# Patient Record
Sex: Male | Born: 1985
Health system: Southern US, Community
[De-identification: ages and names within clinical notes are randomized; demographics above are authoritative.]

## PROBLEM LIST (undated history)

## (undated) DIAGNOSIS — K5010 Crohn's disease of large intestine without complications: Secondary | ICD-10-CM

## (undated) DIAGNOSIS — E0500 Thyrotoxicosis with diffuse goiter without thyrotoxic crisis or storm: Secondary | ICD-10-CM

## (undated) HISTORY — PX: NO PAST SURGERIES: SHX2092

---
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
## 2003-04-19 ENCOUNTER — Ambulatory Visit (HOSPITAL_BASED_OUTPATIENT_CLINIC_OR_DEPARTMENT_OTHER): Admission: RE | Admit: 2003-04-19 | Discharge: 2003-04-19 | Payer: Self-pay | Admitting: *Deleted

## 2003-10-25 ENCOUNTER — Ambulatory Visit (HOSPITAL_COMMUNITY): Admission: RE | Admit: 2003-10-25 | Discharge: 2003-10-25 | Payer: Self-pay | Admitting: Gastroenterology

## 2006-12-18 ENCOUNTER — Ambulatory Visit: Payer: Self-pay | Admitting: Sports Medicine

## 2006-12-18 ENCOUNTER — Encounter: Admission: RE | Admit: 2006-12-18 | Discharge: 2006-12-18 | Payer: Self-pay | Admitting: Sports Medicine

## 2007-02-14 ENCOUNTER — Telehealth: Payer: Self-pay | Admitting: Sports Medicine

## 2008-03-31 ENCOUNTER — Encounter: Payer: Self-pay | Admitting: Internal Medicine

## 2012-06-11 ENCOUNTER — Other Ambulatory Visit: Payer: Self-pay | Admitting: Dermatology

## 2014-06-17 ENCOUNTER — Other Ambulatory Visit: Payer: Self-pay | Admitting: Dermatology

## 2019-10-07 ENCOUNTER — Other Ambulatory Visit: Payer: Self-pay

## 2019-10-07 DIAGNOSIS — Z20822 Contact with and (suspected) exposure to covid-19: Secondary | ICD-10-CM

## 2019-10-09 LAB — NOVEL CORONAVIRUS, NAA: SARS-CoV-2, NAA: NOT DETECTED

## 2019-10-22 ENCOUNTER — Emergency Department (HOSPITAL_COMMUNITY): Payer: Managed Care, Other (non HMO)

## 2019-10-22 ENCOUNTER — Emergency Department (HOSPITAL_COMMUNITY)
Admission: EM | Admit: 2019-10-22 | Discharge: 2019-10-22 | Disposition: A | Discharge status: Home / Self Care | Payer: Managed Care, Other (non HMO) | Attending: Emergency Medicine | Admitting: Emergency Medicine

## 2019-10-22 ENCOUNTER — Encounter (HOSPITAL_COMMUNITY): Payer: Self-pay

## 2019-10-22 ENCOUNTER — Other Ambulatory Visit: Payer: Self-pay

## 2019-10-22 DIAGNOSIS — R197 Diarrhea, unspecified: Secondary | ICD-10-CM | POA: Diagnosis present

## 2019-10-22 DIAGNOSIS — R509 Fever, unspecified: Secondary | ICD-10-CM | POA: Diagnosis not present

## 2019-10-22 DIAGNOSIS — E860 Dehydration: Secondary | ICD-10-CM | POA: Diagnosis not present

## 2019-10-22 DIAGNOSIS — U071 COVID-19: Secondary | ICD-10-CM | POA: Diagnosis not present

## 2019-10-22 HISTORY — DX: Thyrotoxicosis with diffuse goiter without thyrotoxic crisis or storm: E05.00

## 2019-10-22 HISTORY — DX: Crohn's disease of large intestine without complications: K50.10

## 2019-10-22 LAB — BASIC METABOLIC PANEL
Anion gap: 10 (ref 5–15)
BUN: 9 mg/dL (ref 6–20)
CO2: 26 mmol/L (ref 22–32)
Calcium: 8.6 mg/dL — ABNORMAL LOW (ref 8.9–10.3)
Chloride: 99 mmol/L (ref 98–111)
Creatinine, Ser: 1.14 mg/dL (ref 0.61–1.24)
GFR calc Af Amer: >60 mL/min (ref >60)
GFR calc non Af Amer: >60 mL/min (ref >60)
Glucose, Bld: 102 mg/dL — ABNORMAL HIGH (ref 70–99)
Potassium: 4 mmol/L (ref 3.5–5.1)
Sodium: 135 mmol/L (ref 135–145)

## 2019-10-22 LAB — FIBRINOGEN: Fibrinogen: 534 mg/dL — ABNORMAL HIGH (ref 210–475)

## 2019-10-22 LAB — CBC
HCT: 43.7 % (ref 39.0–52.0)
Hemoglobin: 15.2 g/dL (ref 13.0–17.0)
MCH: 30.6 pg (ref 26.0–34.0)
MCHC: 34.8 g/dL (ref 30.0–36.0)
MCV: 88.1 fL (ref 80.0–100.0)
Platelets: 213 10*3/uL (ref 150–400)
RBC: 4.96 MIL/uL (ref 4.22–5.81)
RDW: 11.6 % (ref 11.5–15.5)
WBC: 9.4 10*3/uL (ref 4.0–10.5)
nRBC: 0 % (ref 0.0–0.2)

## 2019-10-22 LAB — HEPATIC FUNCTION PANEL
ALT: 99 U/L — ABNORMAL HIGH (ref 0–44)
AST: 27 U/L (ref 15–41)
Albumin: 3.6 g/dL (ref 3.5–5.0)
Alkaline Phosphatase: 128 U/L — ABNORMAL HIGH (ref 38–126)
Bilirubin, Direct: 0.2 mg/dL (ref 0.0–0.2)
Indirect Bilirubin: 0.8 mg/dL (ref 0.3–0.9)
Total Bilirubin: 1 mg/dL (ref 0.3–1.2)
Total Protein: 7.2 g/dL (ref 6.5–8.1)

## 2019-10-22 LAB — LACTATE DEHYDROGENASE: LDH: 170 U/L (ref 98–192)

## 2019-10-22 LAB — C-REACTIVE PROTEIN: CRP: 9.4 mg/dL — ABNORMAL HIGH (ref <1.0)

## 2019-10-22 LAB — PROCALCITONIN: Procalcitonin: <0.1 ng/mL

## 2019-10-22 LAB — TRIGLYCERIDES: Triglycerides: 86 mg/dL (ref <150)

## 2019-10-22 LAB — LACTIC ACID, PLASMA: Lactic Acid, Venous: 0.9 mmol/L (ref 0.5–1.9)

## 2019-10-22 LAB — T4, FREE: Free T4: 1.63 ng/dL — ABNORMAL HIGH (ref 0.61–1.12)

## 2019-10-22 LAB — D-DIMER, QUANTITATIVE: D-Dimer, Quant: 0.93 ug/mL-FEU — ABNORMAL HIGH (ref 0.00–0.50)

## 2019-10-22 LAB — TSH: TSH: <0.01 u[IU]/mL — ABNORMAL LOW (ref 0.350–4.500)

## 2019-10-22 LAB — FERRITIN: Ferritin: 185 ng/mL (ref 24–336)

## 2019-10-22 MED ORDER — ACETAMINOPHEN 500 MG PO TABS: 1000.0000 mg | ORAL_TABLET | Freq: Once | ORAL | Status: DC

## 2019-10-22 MED ORDER — SODIUM CHLORIDE 0.9 % IV BOLUS
1000.0000 mL | Freq: Once | INTRAVENOUS | Status: AC
  Administered 2019-10-22: 1000 mL via INTRAVENOUS

## 2019-10-22 MED ORDER — DEXAMETHASONE SODIUM PHOSPHATE 10 MG/ML IJ SOLN
10.0000 mg | Freq: Once | INTRAMUSCULAR | Status: AC
  Administered 2019-10-22: 21:00:00 10 mg via INTRAVENOUS
  Filled 2019-10-22: qty 1

## 2019-10-22 MED ORDER — IBUPROFEN 400 MG PO TABS
600.0000 mg | ORAL_TABLET | Freq: Once | ORAL | Status: AC
  Administered 2019-10-22: 21:00:00 600 mg via ORAL
  Filled 2019-10-22: qty 1

## 2019-10-22 NOTE — ED Provider Notes (Addendum)
[facility] EMERGENCY DEPARTMENT Provider Note   CSN: [id number] Arrival date & time: 10/22/19  1947     History Chief Complaint  Patient presents with  . Covid+/Diarrhea    Raymond Branch is a 33 y.o. male.  Pt presents to the ED today with diarrhea and fever.  Pt has had a covid exposure and tested positive for covid today.  The pt has Crohn's disease and has had a lot of diarrhea.  Pt said he's not sure if the diarrhea is from Crohn's or from covid.  He called his GI doctor who told him to come in for IVFs.  The pt said he took tylenol around 1800.  No sob.        Past Medical History:  Diagnosis Date  . Crohn's colitis ([place])   . Graves disease     There are no problems to display for this patient.       No family history on file.  Social History   Tobacco Use  . Smoking status: Never Smoker  . Smokeless tobacco: Never Used  Substance Use Topics  . Alcohol use: Yes  . Drug use: Never    Home Medications Prior to Admission medications   Not on File    Allergies    Patient has no known allergies.  Review of Systems   Review of Systems  Constitutional: Positive for fatigue and fever.  Gastrointestinal: Positive for diarrhea.  All other systems reviewed and are negative.   Physical Exam Updated Vital Signs BP 132/84   Pulse (!) 126   Temp 100 F (37.8 C) (Oral)   Resp (!) 31   SpO2 96%   Physical Exam Vitals and nursing note reviewed.  Constitutional:      Appearance: Normal appearance.  HENT:     Head: Normocephalic and atraumatic.     Right Ear: External ear normal.     Left Ear: External ear normal.     Nose: Nose normal.     Mouth/Throat:     Mouth: Mucous membranes are dry.  Eyes:     Extraocular Movements: Extraocular movements intact.     Conjunctiva/sclera: Conjunctivae normal.     Pupils: Pupils are equal, round, and reactive to light.  Cardiovascular:     Rate and Rhythm: Regular rhythm. Tachycardia  present.     Pulses: Normal pulses.     Heart sounds: Normal heart sounds.  Pulmonary:     Effort: Pulmonary effort is normal.     Breath sounds: Normal breath sounds.  Abdominal:     General: Abdomen is flat. Bowel sounds are normal.     Palpations: Abdomen is soft.  Musculoskeletal:        General: Normal range of motion.     Cervical back: Normal range of motion and neck supple.  Skin:    Capillary Refill: Capillary refill takes less than 2 seconds.  Neurological:     General: No focal deficit present.     Mental Status: He is alert and oriented to person, place, and time.  Psychiatric:        Mood and Affect: Mood normal.        Behavior: Behavior normal.        Thought Content: Thought content normal.        Judgment: Judgment normal.     ED Results / Procedures / Treatments   Labs (all labs ordered are listed, but only abnormal results are displayed) Labs Reviewed  BASIC  METABOLIC PANEL - Abnormal; Notable for the following components:      Result Value   Glucose, Bld 102 (*)    Calcium 8.6 (*)    All other components within normal limits  D-DIMER, QUANTITATIVE (NOT AT [facility]) - Abnormal; Notable for the following components:   D-Dimer, Quant 0.93 (*)    All other components within normal limits  FIBRINOGEN - Abnormal; Notable for the following components:   Fibrinogen 534 (*)    All other components within normal limits  C-REACTIVE PROTEIN - Abnormal; Notable for the following components:   CRP 9.4 (*)    All other components within normal limits  HEPATIC FUNCTION PANEL - Abnormal; Notable for the following components:   ALT 99 (*)    Alkaline Phosphatase 128 (*)    All other components within normal limits  TSH - Abnormal; Notable for the following components:   TSH <0.010 (*)    All other components within normal limits  T3, FREE - Abnormal; Notable for the following components:   T3, Free 4.7 (*)    All other components within normal limits  T4, FREE -  Abnormal; Notable for the following components:   Free T4 1.63 (*)    All other components within normal limits  CULTURE, BLOOD (ROUTINE X 2)  CULTURE, BLOOD (ROUTINE X 2)  CBC  LACTIC ACID, PLASMA  PROCALCITONIN  LACTATE DEHYDROGENASE  FERRITIN  TRIGLYCERIDES    EKG None  Not coming through on MUSE HR 86.  NO ST or T wave changes. Sinus arrhythmia  Radiology No results found.  Procedures Procedures (including critical care time)  Medications Ordered in ED Medications  sodium chloride 0.9 % bolus 1,000 mL (0 mLs Intravenous Stopped 10/22/19 2249)  ibuprofen (ADVIL) tablet 600 mg (600 mg Oral Given 10/22/19 2129)  dexamethasone (DECADRON) injection 10 mg (10 mg Intravenous Given 10/22/19 2129)    ED Course  I have reviewed the triage vital signs and the nursing notes.  Pertinent labs & imaging results that were available during my care of the patient were reviewed by me and considered in my medical decision making (see chart for details).    MDM Rules/Calculators/A&P                      Pt is feeling better.  He is dehydrated, but is able to tolerate po fluids.    His TSH is <0.01.  He has been on methimazole for the past month.  He sees Dr. Buddy Duty from endocrinology.  He is instructed to f/u with him in the next few days.  I did add on T3 and T4 as pt will likely need an evisit as he has covid.   His oxygenation is great and CXR ok.    Pt knows to return if worse.  F/u with pcp.  Raymond Branch was evaluated in Emergency Department on 11/09/2019 for the symptoms described in the history of present illness. He was evaluated in the context of the global COVID-19 pandemic, which necessitated consideration that the patient might be at risk for infection with the SARS-CoV-2 virus that causes COVID-19. Institutional protocols and algorithms that pertain to the evaluation of patients at risk for COVID-19 are in a state of rapid change based on information released by regulatory  bodies including the CDC and federal and state organizations. These policies and algorithms were followed during the patient's care in the ED.   Final Clinical Impression(s) / ED Diagnoses Final diagnoses:  Dehydration  COVID-19 virus infection  Diarrhea, unspecified type    Rx / DC Orders ED Discharge Orders    None       Isla Pence, MD 10/22/19 2250    Isla Pence, MD 11/09/19 1030

## 2019-10-22 NOTE — Discharge Instructions (Addendum)
Your TSH today was <0.01.  Call Dr. Buddy Duty tomorrow to see if he wants you to increase your methimazole.  Continue taking your prednisone for 1 more week.  Drink lots of fluids.

## 2019-10-22 NOTE — ED Triage Notes (Signed)
Pt tested positive for Covid this a.m. and due to his underlying condition of Crohn's and continued diarrhea, his GI dr told him to come to ER for IV fluids.

## 2019-10-24 LAB — T3, FREE: T3, Free: 4.7 pg/mL — ABNORMAL HIGH (ref 2.0–4.4)

## 2019-10-27 LAB — CULTURE, BLOOD (ROUTINE X 2)
Culture: NO GROWTH
Culture: NO GROWTH
Special Requests: ADEQUATE
Special Requests: ADEQUATE

## 2020-04-27 ENCOUNTER — Encounter: Payer: Self-pay | Admitting: Family Medicine

## 2020-04-27 ENCOUNTER — Ambulatory Visit (INDEPENDENT_AMBULATORY_CARE_PROVIDER_SITE_OTHER): Payer: Managed Care, Other (non HMO)

## 2020-04-27 ENCOUNTER — Other Ambulatory Visit: Payer: Self-pay

## 2020-04-27 ENCOUNTER — Ambulatory Visit: Payer: Managed Care, Other (non HMO) | Admitting: Family Medicine

## 2020-04-27 VITALS — BP 100/84 | HR 70 | Wt 201.0 lb

## 2020-04-27 DIAGNOSIS — G8929 Other chronic pain: Secondary | ICD-10-CM | POA: Diagnosis not present

## 2020-04-27 DIAGNOSIS — M545 Low back pain, unspecified: Secondary | ICD-10-CM

## 2020-04-27 DIAGNOSIS — M999 Biomechanical lesion, unspecified: Secondary | ICD-10-CM | POA: Insufficient documentation

## 2020-04-27 NOTE — Progress Notes (Signed)
[facility] [address] [place] Phone: [phone] Subjective:   I Raymond Branch am serving as a Education administrator for Dr. Hulan Saas.  This visit occurred during the SARS-CoV-2 public health emergency.  Safety protocols were in place, including screening questions prior to the visit, additional usage of staff PPE, and extensive cleaning of exam room while observing appropriate contact time as indicated for disinfecting solutions.   I'm seeing this patient by the request  of:  Patient, No Pcp Per  CC: Low back pain  [id number]  Raymond Branch is a 34 y.o. male coming in with complaint of low back pain. Numbness in his feet after crossing his legs for long periods of time.   Onset- 2 months  Location - lower  Duration-  Character- tight  Aggravating factors- painful after sitting, crossing his legs  Reliving factors-  Therapies tried- stretching  Severity-5 out of 10.  Does not stop him from any true activities at the moment.     Past Medical History:  Diagnosis Date  . Crohn's colitis ([place])   . Graves disease     Social History   Socioeconomic History  . Marital status: Married    Spouse name: Not on file  . Number of children: Not on file  . Years of education: Not on file  . Highest education level: Not on file  Occupational History  . Not on file  Tobacco Use  . Smoking status: Never Smoker  . Smokeless tobacco: Never Used  Vaping Use  . Vaping Use: Never used  Substance and Sexual Activity  . Alcohol use: Yes  . Drug use: Never  . Sexual activity: Not Currently  Other Topics Concern  . Not on file  Social History Narrative  . Not on file   Social Determinants of Health   Financial Resource Strain:   . Difficulty of Paying Living Expenses:   Food Insecurity:   . Worried About Charity fundraiser in the Last Year:   . Arboriculturist in the Last Year:   Transportation Needs:   . Film/video editor  (Medical):   Marland Kitchen Lack of Transportation (Non-Medical):   Physical Activity:   . Days of Exercise per Week:   . Minutes of Exercise per Session:   Stress:   . Feeling of Stress :   Social Connections:   . Frequency of Communication with Friends and Family:   . Frequency of Social Gatherings with Friends and Family:   . Attends Religious Services:   . Active Member of Clubs or Organizations:   . Attends Archivist Meetings:   Marland Kitchen Marital Status:    No Known Allergies No family history on file. No current outpatient medications on file.   Reviewed prior external information including notes and imaging from  primary care provider As well as notes that were available from care everywhere and other healthcare systems.  Past medical history, social, surgical and family history all reviewed in electronic medical record.  No pertanent information unless stated regarding to the chief complaint.   Review of Systems:  No headache, visual changes, nausea, vomiting, diarrhea, constipation, dizziness, abdominal pain, skin rash, fevers, chills, night sweats, weight loss, swollen lymph nodes, body aches, joint swelling, chest pain, shortness of breath, mood changes. POSITIVE muscle aches  Objective  Blood pressure 100/84, pulse 70, weight 201 lb (91.2 kg), SpO2 97 %.   General: No apparent distress alert and oriented x3  mood and affect normal, dressed appropriately.  HEENT: Pupils equal, extraocular movements intact  Respiratory: Patient's speak in full sentences and does not appear short of breath  Cardiovascular: No lower extremity edema, non tender, no erythema  Neuro: Cranial nerves II through XII are intact, neurovascularly intact in all extremities with 2+ DTRs and 2+ pulses.  Gait normal with good balance and coordination.  MSK:  Non tender with full range of motion and good stability and symmetric strength and tone of shoulders, elbows, wrist, hip, knee and ankles bilaterally.    Low back exam some mild loss of lordosis.  Some tenderness to palpation in the paraspinal musculature of the lumbar spine right greater than left.  Tightness over the right FABER test noted.  Does have some very mild limited range of motion in the last 5 degrees of flexion  Osteopathic findings C2 flexed rotated and side bent right T9 extended rotated and side bent left L2 flexed rotated and side bent right Sacrum right on right    Impression and Recommendations:     The above documentation has been reviewed and is accurate and complete Raymond Pulley, DO       Note: This dictation was prepared with Dragon dictation along with smaller phrase technology. Any transcriptional errors that result from this process are unintentional.

## 2020-04-27 NOTE — Assessment & Plan Note (Signed)
Patient does have more of the low back pain.  Describing as a dull, throbbing aching sensation.  Patient should do well with conservative therapy.  Patient given home exercise today that I think will be helpful.  Discussed icing regimen.  Will increase activity slowly.  Follow-up again 4 to 8 weeks.  Attempted osteopathic manipulation today

## 2020-04-27 NOTE — Assessment & Plan Note (Addendum)
° °  Decision today to treat with OMT was based on Physical Exam  After verbal consent patient was treated with HVLA, ME, FPR techniques in cervical, thoracic,  lumbar and sacral areas, all areas are chronic   Patient tolerated the procedure well with improvement in symptoms  Patient given exercises, stretches and lifestyle modifications  See medications in patient instructions if given  Patient will follow up in 4-8 weeks

## 2020-04-27 NOTE — Patient Instructions (Signed)
Good to see you Adjust handle bars up on bike Exercise 3 times a week Xray on the way out Stay active its not the mattress See me again in 5-6 weeks

## 2020-06-02 ENCOUNTER — Ambulatory Visit: Payer: Managed Care, Other (non HMO) | Admitting: Family Medicine

## 2020-06-02 ENCOUNTER — Other Ambulatory Visit: Payer: Self-pay

## 2020-06-02 ENCOUNTER — Encounter: Payer: Self-pay | Admitting: Family Medicine

## 2020-06-02 VITALS — BP 118/74 | HR 64 | Wt 199.0 lb

## 2020-06-02 DIAGNOSIS — M999 Biomechanical lesion, unspecified: Secondary | ICD-10-CM | POA: Diagnosis not present

## 2020-06-02 DIAGNOSIS — G8929 Other chronic pain: Secondary | ICD-10-CM | POA: Diagnosis not present

## 2020-06-02 DIAGNOSIS — M79609 Pain in unspecified limb: Secondary | ICD-10-CM | POA: Diagnosis not present

## 2020-06-02 DIAGNOSIS — M545 Low back pain, unspecified: Secondary | ICD-10-CM

## 2020-06-02 NOTE — Progress Notes (Signed)
  [facility] [place] Phone: [phone] Subjective:    I'm seeing this patient by the request  of:  Patient, No Pcp Per  CC: Back pain follow-up, knee pain  [id number]  Raymond Branch is a 34 y.o. male coming in with complaint of back and neck pain. OMT 04/27/2020. Patient states that his back isn't as sore and tight.   Left knee pain. 2004 surgery lateral meniscus. Tightness in back of knee.  States that he does not feel like he has full movement symptoms.  Medications patient has been prescribed: Only taking over-the-counter medicines           Reviewed prior external information including notes and imaging from previsou exam, outside providers and external EMR if available.   As well as notes that were available from care everywhere and other healthcare systems.  Past medical history, social, surgical and family history all reviewed in electronic medical record.  No pertanent information unless stated regarding to the chief complaint.   Past Medical History:  Diagnosis Date  . Crohn's colitis ([place])   . Graves disease     No Known Allergies   Review of Systems:  No headache, visual changes, nausea, vomiting, diarrhea, constipation, dizziness, abdominal pain, skin rash, fevers, chills, night sweats, weight loss, swollen lymph nodes, body aches, joint swelling, chest pain, shortness of breath, mood changes. POSITIVE  Objective  Blood pressure 118/74, pulse 64, weight 199 lb (90.3 kg), SpO2 95 %.   General: No apparent distress alert and oriented x3 mood and affect normal, dressed appropriately.  HEENT: Pupils equal, extraocular movements intact  Respiratory: Patient's speak in full sentences and does not appear short of breath  Cardiovascular: No lower extremity edema, non tender, no erythema  Neuro: Cranial nerves II through XII are intact, neurovascularly intact in all extremities with 2+ DTRs and 2+  pulses.  Gait normal with good balance and coordination.  MSK: Left knee exam shows the patient does have more tenderness over the popliteal area.  Negative Thompson.  Patient has some mild audible popping of the popliteal tendon noted.  Likely some arthritic changes of the lateral compartment.  Osteopathic findings   T5 extended rotated and side bent left L2 flexed rotated and side bent right Sacrum right on right     Assessment and Plan:  Low back pain Stable overall.  Responding well to manipulation.  Discussed posture and ergonomics, discussed core strengthening.  Follow-up again in 4 to 6 weeks  Nonallopathic lesion of cervical region   Decision today to treat with OMT was based on Physical Exam  After verbal consent patient was treated with HVLA, ME, FPR techniques in cervical, thoracic, rib, lumbar and sacral areas, all areas are chronic   Patient tolerated the procedure well with improvement in symptoms  Patient given exercises, stretches and lifestyle modifications  See medications in patient instructions if given  Patient will follow up in 4-6 weeks        The above documentation has been reviewed and is accurate and complete [name], DO       Note: This dictation was prepared with Dragon dictation along with smaller phrase technology. Any transcriptional errors that result from this process are unintentional.

## 2020-06-02 NOTE — Assessment & Plan Note (Signed)
Stable overall.  Responding well to manipulation.  Discussed posture and ergonomics, discussed core strengthening.  Follow-up again in 4 to 6 weeks

## 2020-06-02 NOTE — Patient Instructions (Addendum)
Knee compression sleeve  latereral unloader webbed knee brace Congrats! See me again in 2 months

## 2020-06-02 NOTE — Assessment & Plan Note (Addendum)
   Decision today to treat with OMT was based on Physical Exam  After verbal consent patient was treated with HVLA, ME, FPR techniques in , thoracic,, lumbar and sacral areas, all areas are chronic   Patient tolerated the procedure well with improvement in symptoms  Patient given exercises, stretches and lifestyle modifications  See medications in patient instructions if given  Patient will follow up in 4-6 weeks

## 2020-06-02 NOTE — Assessment & Plan Note (Signed)
Likely tenodnitis, discussed possible OA, lateral compartment.  Continue need x-rays at follow-up.  May need a lateral unloader brace and will consider.  Follow-up again 8 weeks

## 2020-06-06 ENCOUNTER — Telehealth: Payer: Self-pay

## 2020-06-06 NOTE — Telephone Encounter (Signed)
Called to notify patient that knee brace is here. He can come any time before 4:30 without an appointment to be fitted for the brace.

## 2020-06-07 ENCOUNTER — Telehealth: Payer: Self-pay

## 2020-06-07 NOTE — Telephone Encounter (Signed)
Left message notifying patient that knee brace is here.

## 2020-08-02 NOTE — Progress Notes (Signed)
[place] Phone: [phone] Subjective:   [name], am serving as a scribe for Dr. Hulan Saas. This visit occurred during the SARS-CoV-2 public health emergency.  Safety protocols were in place, including screening questions prior to the visit, additional usage of staff PPE, and extensive cleaning of exam room while observing appropriate contact time as indicated for disinfecting solutions.   I'm seeing this patient by the request  of:  Patient, No Pcp Per  CC: Knee pain and back pain follow-up  [id number]   06/02/2020 Likely tenodnitis, discussed possible OA, lateral compartment.  Continue need x-rays at follow-up.  May need a lateral unloader brace and will consider.  Follow-up again 8 weeks  Update 08/03/2020 Raymond Branch is a 34 y.o. male coming in with complaint of knee and back pain. OMT 06/02/2020. States his back pain did not go away for the 1st month after seeing [place]. Back pain is better today.  Patient states that it does still want to consider the manipulation.  Has felt better with it than previously.  Been doing the exercises very regularly.  Working out intermittently.  Has had difficulty with his Graves' disease and because of this has gained some weight.  Knows that that is causing increased stress right now.  Knee pain is not any better. Squatting and lunging continue to bother him. Is active despite pain.  Otherwise patient does not notice anything significant.     Past Medical History:  Diagnosis Date  . Crohn's colitis ([place])   . Graves disease    Past Surgical History:  Procedure Laterality Date  . NO PAST SURGERIES     Social History   Socioeconomic History  . Marital status: Married    Spouse name: Not on file  . Number of children: Not on file  . Years of education: Not on file  . Highest education level: Not on file  Occupational History  . Not on file  Tobacco Use    . Smoking status: Never Smoker  . Smokeless tobacco: Never Used  Vaping Use  . Vaping Use: Never used  Substance and Sexual Activity  . Alcohol use: Yes  . Drug use: Never  . Sexual activity: Not Currently  Other Topics Concern  . Not on file  Social History Narrative  . Not on file   Social Determinants of Health   Financial Resource Strain:   . Difficulty of Paying Living Expenses: Not on file  Food Insecurity:   . Worried About Charity fundraiser in the Last Year: Not on file  . Ran Out of Food in the Last Year: Not on file  Transportation Needs:   . Lack of Transportation (Medical): Not on file  . Lack of Transportation (Non-Medical): Not on file  Physical Activity:   . Days of Exercise per Week: Not on file  . Minutes of Exercise per Session: Not on file  Stress:   . Feeling of Stress : Not on file  Social Connections:   . Frequency of Communication with Friends and Family: Not on file  . Frequency of Social Gatherings with Friends and Family: Not on file  . Attends Religious Services: Not on file  . Active Member of Clubs or Organizations: Not on file  . Attends Archivist Meetings: Not on file  . Marital Status: Not on file   No Known Allergies No family history on file. No current outpatient medications  on file.   Reviewed prior external information including notes and imaging from  primary care provider As well as notes that were available from care everywhere and other healthcare systems.  Past medical history, social, surgical and family history all reviewed in electronic medical record.  No pertanent information unless stated regarding to the chief complaint.   Review of Systems:  No headache, visual changes, nausea, vomiting, diarrhea, constipation, dizziness, abdominal pain, skin rash, fevers, chills, night sweats, weight loss, swollen lymph nodes, body aches, joint swelling, chest pain, shortness of breath, mood changes. POSITIVE muscle  aches  Objective  Blood pressure 112/82, pulse 85, height 5' 11"  (1.803 m), weight 206 lb (93.4 kg), SpO2 98 %.   General: No apparent distress alert and oriented x3 mood and affect normal, dressed appropriately.  HEENT: Pupils equal, extraocular movements intact  Respiratory: Patient's speak in full sentences and does not appear short of breath  Cardiovascular: No lower extremity edema, non tender, no erythema  Neuro: Cranial nerves II through XII are intact, neurovascularly intact in all extremities with 2+ DTRs and 2+ pulses.  Gait normal with good balance and coordination.  MSK:   Low back exam does have some mild discomfort noted in the paraspinal musculature, does have some tightness in the L4-5 L5-S1.  Mild pain with flexion extension.  Osteopathic findings  C4 flexed rotated and side bent left T9 extended rotated and side bent left L2 flexed rotated and side bent right Sacrum right on right    Impression and Recommendations:     The above documentation has been reviewed and is accurate and complete [name], DO       Note: This dictation was prepared with Dragon dictation along with smaller phrase technology. Any transcriptional errors that result from this process are unintentional.

## 2020-08-03 ENCOUNTER — Other Ambulatory Visit: Payer: Self-pay

## 2020-08-03 ENCOUNTER — Ambulatory Visit (INDEPENDENT_AMBULATORY_CARE_PROVIDER_SITE_OTHER): Payer: Managed Care, Other (non HMO) | Admitting: Family Medicine

## 2020-08-03 ENCOUNTER — Encounter: Payer: Self-pay | Admitting: Family Medicine

## 2020-08-03 VITALS — BP 112/82 | HR 85 | Ht 71.0 in | Wt 206.0 lb

## 2020-08-03 DIAGNOSIS — G8929 Other chronic pain: Secondary | ICD-10-CM

## 2020-08-03 DIAGNOSIS — M545 Low back pain: Secondary | ICD-10-CM | POA: Diagnosis not present

## 2020-08-03 DIAGNOSIS — M999 Biomechanical lesion, unspecified: Secondary | ICD-10-CM | POA: Diagnosis not present

## 2020-08-03 NOTE — Assessment & Plan Note (Signed)
   Decision today to treat with OMT was based on Physical Exam  After verbal consent patient was treated with HVLA, ME, FPR techniques in cervical, thoracic,  lumbar and sacral areas, all areas are chronic   Patient tolerated the procedure well with improvement in symptoms  Patient given exercises, stretches and lifestyle modifications  See medications in patient instructions if given  Patient will follow up in 4-8 weeks

## 2020-08-03 NOTE — Patient Instructions (Signed)
Look into DHEA 53m daily for 4 weeks Exercises on door 30 reps each side daily Continue other exercises as your cool down See me in 7-8 weeks

## 2020-08-03 NOTE — Assessment & Plan Note (Signed)
Low back pain.  Has been doing relatively well overall.  Patient has had some mild exacerbation after last manipulation but did not want to try it again.  We discussed hip abductor strengthening given him another new exercise that I think will be beneficial.  Discussed icing regimen and home exercise, discussed core strength.  Patient is still working on his thyroid that is still not under control.  Follow-up with me again 6 to 8 weeks

## 2020-09-20 NOTE — Progress Notes (Signed)
[facility] [address] [place] Phone: [phone] Subjective:   I Raymond Branch am serving as a Education administrator for Dr. Hulan Saas.  This visit occurred during the SARS-CoV-2 public health emergency.  Safety protocols were in place, including screening questions prior to the visit, additional usage of staff PPE, and extensive cleaning of exam room while observing appropriate contact time as indicated for disinfecting solutions.   I'm seeing this patient by the request  of:  Patient, No Pcp Per  CC: Low back pain follow-up  [id number]  Raymond Branch is a 34 y.o. male coming in with complaint of back and neck pain. OMT 08/03/2020. Patient states he is making progress.  Patient states that he is continuing to work on the core strengthening a little bit.  Has not been doing the exercises for the hip abductors otherwise regularly.  Medications patient has been prescribed: None          Reviewed prior external information including notes and imaging from previsou exam, outside providers and external EMR if available.   As well as notes that were available from care everywhere and other healthcare systems.  Past medical history, social, surgical and family history all reviewed in electronic medical record.  No pertanent information unless stated regarding to the chief complaint.   Past Medical History:  Diagnosis Date  . Crohn's colitis ([place])   . Graves disease     No Known Allergies   Review of Systems:  No headache, visual changes, nausea, vomiting, diarrhea, constipation, dizziness, abdominal pain, skin rash, fevers, chills, night sweats, weight loss, swollen lymph nodes, body aches, joint swelling, chest pain, shortness of breath, mood changes. POSITIVE muscle aches  Objective  Blood pressure 138/82, pulse 62, height 5' 11"  (1.803 m), weight 201 lb (91.2 kg), SpO2 97 %.   General: No apparent distress alert and oriented x3  mood and affect normal, dressed appropriately.  HEENT: Pupils equal, extraocular movements intact  Respiratory: Patient's speak in full sentences and does not appear short of breath  Cardiovascular: No lower extremity edema, non tender, no erythema  Neuro: Cranial nerves II through XII are intact, neurovascularly intact in all extremities with 2+ DTRs and 2+ pulses.  Gait normal with good balance and coordination.  MSK:  Non tender with full range of motion and good stability and symmetric strength and tone of shoulders, elbows, wrist, hip, knee and ankles bilaterally.  Low back exam still shows some tightness around the sacroiliac joint some.  Tightness Faber on the right compared to left.  Patient has 5 out of 5 strength in lower extremities  Osteopathic findings  T11 extended rotated and side bent left L1 flexed rotated and side bent right Sacrum right on right       Assessment and Plan:   Low back pain Chronic problem but overall stable.  Patient does do a lot of bending and walking around.  Continuing to try to stay active.  Patient responds well to manipulation.  Follow-up again 4 to 8 weeks.    Nonallopathic problems  Decision today to treat with OMT was based on Physical Exam  After verbal consent patient was treated with HVLA, ME, FPR techniques in  thoracic, lumbar, and sacral  areas  Patient tolerated the procedure well with improvement in symptoms  Patient given exercises, stretches and lifestyle modifications  See medications in patient instructions if given  Patient will follow up in 4-8 weeks  The above documentation has been reviewed and is accurate and complete [name], DO       Note: This dictation was prepared with Dragon dictation along with smaller phrase technology. Any transcriptional errors that result from this process are unintentional.

## 2020-09-21 ENCOUNTER — Ambulatory Visit: Payer: Managed Care, Other (non HMO) | Admitting: Family Medicine

## 2020-09-21 ENCOUNTER — Other Ambulatory Visit: Payer: Self-pay

## 2020-09-21 ENCOUNTER — Encounter: Payer: Self-pay | Admitting: Family Medicine

## 2020-09-21 VITALS — BP 138/82 | HR 62 | Ht 71.0 in | Wt 201.0 lb

## 2020-09-21 DIAGNOSIS — G8929 Other chronic pain: Secondary | ICD-10-CM

## 2020-09-21 DIAGNOSIS — M999 Biomechanical lesion, unspecified: Secondary | ICD-10-CM | POA: Diagnosis not present

## 2020-09-21 DIAGNOSIS — M545 Low back pain, unspecified: Secondary | ICD-10-CM

## 2020-09-21 NOTE — Assessment & Plan Note (Signed)
Chronic problem but overall stable.  Patient does do a lot of bending and walking around.  Continuing to try to stay active.  Patient responds well to manipulation.  Follow-up again 4 to 8 weeks.

## 2020-09-21 NOTE — Patient Instructions (Signed)
Keep working on core strength and everything else Glad you are doing better See me again in 6-8 weeks

## 2020-10-29 ENCOUNTER — Emergency Department (HOSPITAL_COMMUNITY)
Admission: EM | Admit: 2020-10-29 | Discharge: 2020-10-30 | Disposition: A | Discharge status: Home / Self Care | Payer: Managed Care, Other (non HMO) | Attending: Emergency Medicine | Admitting: Emergency Medicine

## 2020-10-29 DIAGNOSIS — R197 Diarrhea, unspecified: Secondary | ICD-10-CM | POA: Diagnosis not present

## 2020-10-29 DIAGNOSIS — R1110 Vomiting, unspecified: Secondary | ICD-10-CM | POA: Insufficient documentation

## 2020-10-29 DIAGNOSIS — K5090 Crohn's disease, unspecified, without complications: Secondary | ICD-10-CM | POA: Diagnosis not present

## 2020-10-29 DIAGNOSIS — R1084 Generalized abdominal pain: Secondary | ICD-10-CM | POA: Diagnosis present

## 2020-10-29 DIAGNOSIS — K529 Noninfective gastroenteritis and colitis, unspecified: Secondary | ICD-10-CM

## 2020-10-29 LAB — CBC WITH DIFFERENTIAL/PLATELET
Abs Immature Granulocytes: 0.03 10*3/uL (ref 0.00–0.07)
Basophils Absolute: 0 10*3/uL (ref 0.0–0.1)
Basophils Relative: 0 %
Eosinophils Absolute: 0 10*3/uL (ref 0.0–0.5)
Eosinophils Relative: 0 %
HCT: 45 % (ref 39.0–52.0)
Hemoglobin: 15.9 g/dL (ref 13.0–17.0)
Immature Granulocytes: 0 %
Lymphocytes Relative: 2 %
Lymphs Abs: 0.2 10*3/uL — ABNORMAL LOW (ref 0.7–4.0)
MCH: 31.8 pg (ref 26.0–34.0)
MCHC: 35.3 g/dL (ref 30.0–36.0)
MCV: 90 fL (ref 80.0–100.0)
Monocytes Absolute: 0.6 10*3/uL (ref 0.1–1.0)
Monocytes Relative: 5 %
Neutro Abs: 11.5 10*3/uL — ABNORMAL HIGH (ref 1.7–7.7)
Neutrophils Relative %: 93 %
Platelets: 224 10*3/uL (ref 150–400)
RBC: 5 MIL/uL (ref 4.22–5.81)
RDW: 11.9 % (ref 11.5–15.5)
WBC: 12.4 10*3/uL — ABNORMAL HIGH (ref 4.0–10.5)
nRBC: 0 % (ref 0.0–0.2)

## 2020-10-29 LAB — COMPREHENSIVE METABOLIC PANEL
ALT: 30 U/L (ref 0–44)
AST: 37 U/L (ref 15–41)
Albumin: 4.2 g/dL (ref 3.5–5.0)
Alkaline Phosphatase: 49 U/L (ref 38–126)
Anion gap: 13 (ref 5–15)
BUN: 21 mg/dL — ABNORMAL HIGH (ref 6–20)
CO2: 20 mmol/L — ABNORMAL LOW (ref 22–32)
Calcium: 9.4 mg/dL (ref 8.9–10.3)
Chloride: 106 mmol/L (ref 98–111)
Creatinine, Ser: 1.26 mg/dL — ABNORMAL HIGH (ref 0.61–1.24)
GFR, Estimated: >60 mL/min (ref >60)
Glucose, Bld: 127 mg/dL — ABNORMAL HIGH (ref 70–99)
Potassium: 4.4 mmol/L (ref 3.5–5.1)
Sodium: 139 mmol/L (ref 135–145)
Total Bilirubin: 1.6 mg/dL — ABNORMAL HIGH (ref 0.3–1.2)
Total Protein: 6.8 g/dL (ref 6.5–8.1)

## 2020-10-29 LAB — T4, FREE: Free T4: 1.18 ng/dL — ABNORMAL HIGH (ref 0.61–1.12)

## 2020-10-29 LAB — TSH: TSH: 0.356 u[IU]/mL (ref 0.350–4.500)

## 2020-10-29 LAB — LIPASE, BLOOD: Lipase: 26 U/L (ref 11–51)

## 2020-10-29 MED ORDER — SODIUM CHLORIDE 0.9 % IV BOLUS
1000.0000 mL | Freq: Once | INTRAVENOUS | Status: AC
Start: 2020-10-29 — End: 2020-10-30
  Administered 2020-10-29: 1000 mL via INTRAVENOUS

## 2020-10-29 MED ORDER — SODIUM CHLORIDE 0.9 % IV BOLUS
1000.0000 mL | Freq: Once | INTRAVENOUS | Status: AC
  Administered 2020-10-29: 1000 mL via INTRAVENOUS

## 2020-10-29 MED ORDER — ONDANSETRON HCL 4 MG/2ML IJ SOLN
4.0000 mg | Freq: Once | INTRAMUSCULAR | Status: AC
  Administered 2020-10-29: 22:00:00 4 mg via INTRAVENOUS
  Filled 2020-10-29: qty 2

## 2020-10-29 NOTE — ED Provider Notes (Signed)
[facility] EMERGENCY DEPARTMENT Provider Note   CSN: [id number] Arrival date & time: 10/29/20  1959     History Chief Complaint  Patient presents with  . Emesis  . Diarrhea    Raymond Branch. is a 34 y.o. male w PMHx of Graves disease, crohns colitis, presenting to the ED with complaint of generalized abdominal discomfort, vomiting and diarrhea that began this after noon. He states he feels as though his thyroid is causing his symptoms as he felt somewhat similar but not as severe when his thyroid was too high. He states about 1 month ago his endocrinologist discontinued his methimazole as his thyroid was well controlled.  He states his symptoms do not feel similar to a current flare, his abdominal pain is normally more sharp in nature.  He states EMS noted a fever though is afebrile here untreated. No sick contacts. No urinary symptoms. No recent antibiotics.  The history is provided by the patient.       Past Medical History:  Diagnosis Date  . Crohn's colitis ([place])   . Graves disease     Patient Active Problem List   Diagnosis Date Noted  . Popliteal pain 06/02/2020  . Low back pain 04/27/2020  . Nonallopathic lesion of lumbar region 04/27/2020  . Nonallopathic lesion of thoracic region 04/27/2020  . Nonallopathic lesion of sacral region 04/27/2020  . Nonallopathic lesion of cervical region 04/27/2020    Past Surgical History:  Procedure Laterality Date  . NO PAST SURGERIES         No family history on file.  Social History   Tobacco Use  . Smoking status: Never Smoker  . Smokeless tobacco: Never Used  Vaping Use  . Vaping Use: Never used  Substance Use Topics  . Alcohol use: Yes  . Drug use: Never    Home Medications Prior to Admission medications   Not on File    Allergies    Patient has no known allergies.  Review of Systems   Review of Systems  Constitutional: Positive for fever.  Gastrointestinal: Positive for  abdominal pain, diarrhea, nausea and vomiting.  All other systems reviewed and are negative.   Physical Exam Updated Vital Signs BP 121/78   Pulse 92   Temp 99 F (37.2 C) (Oral)   Resp 19   Ht 5' 11"  (1.803 m)   Wt 90.3 kg   SpO2 98%   BMI 27.75 kg/m   Physical Exam Vitals and nursing note reviewed.  Constitutional:      Appearance: He is well-developed and well-nourished. He is ill-appearing.  HENT:     Head: Normocephalic and atraumatic.  Eyes:     Conjunctiva/sclera: Conjunctivae normal.  Cardiovascular:     Rate and Rhythm: Normal rate and regular rhythm.  Pulmonary:     Effort: Pulmonary effort is normal. No respiratory distress.     Breath sounds: Normal breath sounds.  Abdominal:     General: A surgical scar is present. Bowel sounds are normal.     Palpations: Abdomen is soft.     Tenderness: There is generalized abdominal tenderness. There is no guarding or rebound.  Skin:    General: Skin is warm.  Neurological:     Mental Status: He is alert.  Psychiatric:        Mood and Affect: Mood and affect normal.        Behavior: Behavior normal.     ED Results / Procedures / Treatments   Labs (  all labs ordered are listed, but only abnormal results are displayed) Labs Reviewed  COMPREHENSIVE METABOLIC PANEL - Abnormal; Notable for the following components:      Result Value   CO2 20 (*)    Glucose, Bld 127 (*)    BUN 21 (*)    Creatinine, Ser 1.26 (*)    Total Bilirubin 1.6 (*)    All other components within normal limits  CBC WITH DIFFERENTIAL/PLATELET - Abnormal; Notable for the following components:   WBC 12.4 (*)    Neutro Abs 11.5 (*)    Lymphs Abs 0.2 (*)    All other components within normal limits  T4, FREE - Abnormal; Notable for the following components:   Free T4 1.18 (*)    All other components within normal limits  LIPASE, BLOOD  TSH  URINALYSIS, ROUTINE W REFLEX MICROSCOPIC  T3    EKG None  Radiology No results  found.  Procedures Procedures (including critical care time)  Medications Ordered in ED Medications  sodium chloride 0.9 % bolus 1,000 mL (has no administration in time range)  sodium chloride 0.9 % bolus 1,000 mL (1,000 mLs Intravenous New Bag/Given 10/29/20 2139)  ondansetron (ZOFRAN) injection 4 mg (4 mg Intravenous Given 10/29/20 2139)    ED Course  I have reviewed the triage vital signs and the nursing notes.  Pertinent labs & imaging results that were available during my care of the patient were reviewed by me and considered in my medical decision making (see chart for details).  Clinical Course as of 10/29/20 2322  Sat Oct 29, 2020  2317 Patient reevaluated. Nausea is improved. States he feels quite dehydrated. Blood pressure is low normal. Will give an additional liter, pending CT imaging. [JR]    Clinical Course User Index [JR] Calixto Pavel, [place] N, PA-C   MDM Rules/Calculators/A&P                          Patient presenting for N/V/D and generalized abdominal "discomfort" that began today.  He has history of Crohn's colitis and hyperthyroidism though recently discontinued his methimazole per his endocrinologist as his thyroid was doing well.  He thinks he is having problems with his thyroid today and requests thyroid studies.  Reports fever by EMS on arrival to his house though is afebrile here without antipyretics.  On evaluation he appears ill, he is afebrile with stable vital signs.  Abdomen is soft with generalized tenderness, no peritoneal signs.    Laboratory work-up initiated.   TSH ordered as well, additional free T3 and T4 ordered per request of patient though presentation does not seem consistent with thyroid storm as he is not tachycardic and has normal mentation.  CT abdomen pelvis ordered.  On reevaluation, patient states his nausea is improved though states his mouth is still quite dry.  Blood pressure is low normal.  Will give additional liter of fluid.   Discussed slightly high free T4 and TSH.  Leukocytosis is present.  Metabolic panel is consistent with signs of mild dehydration.  Pending UA and CT scan, care assumed at shift change from [facility].    Plan to follow UA and CT results, reevaluate and determine disposition.  Final Clinical Impression(s) / ED Diagnoses Final diagnoses:  None    Rx / DC Orders ED Discharge Orders    None       Aleiyah Halpin, [place] N, PA-C 10/29/20 2339    Mesner, Corene Cornea, MD 10/30/20 [phone]

## 2020-10-29 NOTE — ED Triage Notes (Addendum)
Patient BIB EMS r/t uncontrollable N/V/DF x 1 day, developed fever tonight of 101. Hx of Graves disease and Crohn's. Reports symptoms feel similar to previous thyroid storm.  Was taken off methimazole ~1 month ago by provider due to "numbers looking good". Patient reports some episodes of diarrhea since then, took 5 mg three days in a row and it did help symptoms at the time. Doctor rechecked levels after this, stating it wouldn't effect the levels, and numbers still looked good per pt. Did not take it today. Did take Immodium around 1230. Took Phenergan at 1630, but vomited 15 mins after. Patient having light brown, loose diarrhea upon arrival. Denies blood/coffee-ground/dark color in emesis/stool, minus some bright red blood when wiping.

## 2020-10-29 NOTE — ED Provider Notes (Signed)
Patient is a 34 year old male whose care was transferred to me at shift change from [facility].  Her HPI is below:  Raymond Branch. is a 34 y.o. male w PMHx of Graves disease, crohns colitis, presenting to the ED with complaint of generalized abdominal discomfort, vomiting and diarrhea that began this after noon. He states he feels as though his thyroid is causing his symptoms as he felt somewhat similar but not as severe when his thyroid was too high. He states about 1 month ago his endocrinologist discontinued his methimazole as his thyroid was well controlled.  He states his symptoms do not feel similar to a current flare, his abdominal pain is normally more sharp in nature.  He states EMS noted a fever though is afebrile here untreated. No sick contacts. No urinary symptoms. No recent antibiotics.  Physical Exam  BP 121/78    Pulse 92    Temp 99 F (37.2 C) (Oral)    Resp 19    Ht 5\' 11"  (1.803 m)    Wt 90.3 kg    SpO2 98%    BMI 27.75 kg/m   Physical Exam Vitals and nursing note reviewed.  Constitutional:      Appearance: He is well-developed and well-nourished. He is ill-appearing.  HENT:     Head: Normocephalic and atraumatic.  Eyes:     Conjunctiva/sclera: Conjunctivae normal.  Cardiovascular:     Rate and Rhythm: Normal rate and regular rhythm.  Pulmonary:     Effort: Pulmonary effort is normal. No respiratory distress.     Breath sounds: Normal breath sounds.  Abdominal:     General: A surgical scar is present. Bowel sounds are normal.     Palpations: Abdomen is soft.     Tenderness: There is generalized abdominal tenderness. There is no guarding or rebound.  Skin:    General: Skin is warm.  Neurological:     Mental Status: He is alert.  Psychiatric:        Mood and Affect: Mood and affect normal.        Behavior: Behavior normal.  ED Course/Procedures   Clinical Course as of 10/30/20 0528  Sat Oct 29, 2020  2317 Patient reevaluated. Nausea is improved. States he  feels quite dehydrated. Blood pressure is low normal. Will give an additional liter, pending CT imaging. [JR]  Sun Oct 30, 2020  0216 CT Abdomen Pelvis W Contrast IMPRESSION: No CT evidence of acute intra-abdominal pathology.   [LJ]    Clinical Course User Index [JR] Robinson, 0217 N, PA-C [LJ] [place], PA-C    Procedures  MDM  Patient is a 34 year old male whose care was transferred me at shift change.  Presents today with N/V/D as well as generalized abdominal discomfort.  Patient has a history of Crohn's colitis as well as hyperthyroidism.  Recently discontinued methimazole per his endocrinologist.  Thinks he is having problem with his thyroid today.  Fever was reported by EMS on arrival but patient was found to be afebrile upon arrival to the ED.  TSH within normal limits.  T4 slightly elevated at 1.18.  T3 pending, which was ordered at patient's request.  Creatinine mildly elevated at 1.26.  GFR greater than 60.  Patient given 2 L of IV fluids.  CBC showing leukocytosis of 12.4.  Neutrophilia of 11.5.  UA with ketonuria  CT shows no evidence of acute intra-abdominal pathology.  Patient successfully p.o. challenged.  Feel he is safe for discharge at this time.  Given strict return precautions.  Follow-up with his gastroenterologist next week.  His questions were answered and he was amicable at the time of discharge.  His vital signs are within normal limits.         Placido Sou, PA-C 10/30/20 0529    Marily Memos, MD 10/30/20 [phone]

## 2020-10-30 ENCOUNTER — Emergency Department (HOSPITAL_COMMUNITY): Payer: Managed Care, Other (non HMO)

## 2020-10-30 LAB — URINALYSIS, ROUTINE W REFLEX MICROSCOPIC
Bilirubin Urine: NEGATIVE
Glucose, UA: NEGATIVE mg/dL
Hgb urine dipstick: NEGATIVE
Ketones, ur: 20 mg/dL — AB
Leukocytes,Ua: NEGATIVE
Nitrite: NEGATIVE
Protein, ur: NEGATIVE mg/dL
Specific Gravity, Urine: 1.028 (ref 1.005–1.030)
pH: 5 (ref 5.0–8.0)

## 2020-10-30 MED ORDER — ONDANSETRON 4 MG PO TBDP: 4.0000 mg | ORAL_TABLET | Freq: Three times a day (TID) | ORAL | 0 refills | Status: DC | PRN

## 2020-10-30 MED ORDER — IOHEXOL 300 MG/ML  SOLN
100.0000 mL | Freq: Once | INTRAMUSCULAR | Status: AC | PRN
  Administered 2020-10-30: 100 mL via INTRAVENOUS

## 2020-10-30 NOTE — ED Notes (Signed)
Patient tolerated po fluids and crackers well.

## 2020-10-30 NOTE — ED Provider Notes (Signed)
Medical screening examination/treatment/procedure(s) were conducted as a shared visit with non-physician practitioner(s) and myself.  I personally evaluated the patient during the encounter.  Vomiting and diarrhea. No sick contacts. No fevers. One episode of spotting of blood on TP, otherwise no bleeding. H/o crohns and graves. This doesn't seem simlar. On my eval patient slightly tachycardic but not significantly so. Is drinking fluid out of a cup without emesis. Abdomen benign.  Labs with e/o mild dehydration but not significantly so.  Ct reviewed and no acute findings noted.  Continue working on symptomatic treatment and likely discharge.          Merrily Pew, MD 11/01/20 0201

## 2020-10-30 NOTE — ED Notes (Signed)
Pt states has clothes to wear home; pt given disposable brief on request.

## 2020-10-30 NOTE — ED Notes (Signed)
Pt left without discharge papers.

## 2020-10-30 NOTE — Discharge Instructions (Addendum)
Like we discussed, I prescribed you a medication called Zofran.  This can help with breakthrough nausea and vomiting.  Only take this if you are experiencing nausea and vomiting that you cannot control.  Please follow-up with your gastroenterologist next week if your symptoms do not improve.  If they worsen once again and you feel extremely dehydrated, please return to the emergency department.  It was a pleasure to meet you.

## 2020-10-31 LAB — T3: T3, Total: 128 ng/dL (ref 71–180)

## 2020-11-02 ENCOUNTER — Ambulatory Visit: Payer: Managed Care, Other (non HMO) | Admitting: Family Medicine

## 2020-11-02 NOTE — Progress Notes (Deleted)
  [place] Phone: [phone] Subjective:    I'm seeing this patient by the request  of:  Patient, No Pcp Per  CC:   [id number]  Raymond Branch. is a 34 y.o. male coming in with complaint of back and neck pain Patient states   Medications patient has been prescribed:   Taking:         Reviewed prior external information including notes and imaging from previsou exam, outside providers and external EMR if available.   As well as notes that were available from care everywhere and other healthcare systems.  Past medical history, social, surgical and family history all reviewed in electronic medical record.  No pertanent information unless stated regarding to the chief complaint.   Past Medical History:  Diagnosis Date  . Crohn's colitis ([place])   . Graves disease     No Known Allergies   Review of Systems:  No headache, visual changes, nausea, vomiting, diarrhea, constipation, dizziness, abdominal pain, skin rash, fevers, chills, night sweats, weight loss, swollen lymph nodes, body aches, joint swelling, chest pain, shortness of breath, mood changes. POSITIVE muscle aches  Objective  There were no vitals taken for this visit.   General: No apparent distress alert and oriented x3 mood and affect normal, dressed appropriately.  HEENT: Pupils equal, extraocular movements intact  Respiratory: Patient's speak in full sentences and does not appear short of breath  Cardiovascular: No lower extremity edema, non tender, no erythema  Neuro: Cranial nerves II through XII are intact, neurovascularly intact in all extremities with 2+ DTRs and 2+ pulses.  Gait normal with good balance and coordination.  MSK:  Non tender with full range of motion and good stability and symmetric strength and tone of shoulders, elbows, wrist, hip, knee and ankles bilaterally.  Back - Normal skin, Spine with normal alignment and  no deformity.  No tenderness to vertebral process palpation.  Paraspinous muscles are not tender and without spasm.   Range of motion is full at neck and lumbar sacral regions  Osteopathic findings  C2 flexed rotated and side bent right C6 flexed rotated and side bent left T3 extended rotated and side bent right inhaled rib T9 extended rotated and side bent left L2 flexed rotated and side bent right Sacrum right on right       Assessment and Plan:    Nonallopathic problems  Decision today to treat with OMT was based on Physical Exam  After verbal consent patient was treated with HVLA, ME, FPR techniques in cervical, rib, thoracic, lumbar, and sacral  areas  Patient tolerated the procedure well with improvement in symptoms  Patient given exercises, stretches and lifestyle modifications  See medications in patient instructions if given  Patient will follow up in 4-8 weeks      The above documentation has been reviewed and is accurate and complete [name], DO       Note: This dictation was prepared with Dragon dictation along with smaller phrase technology. Any transcriptional errors that result from this process are unintentional.

## 2020-11-10 NOTE — Progress Notes (Unsigned)
[name] [address] [place] [address] Phone: [phone] Subjective:   Raymond Branch, am serving as a scribe for Dr. Antoine Primas. This visit occurred during the SARS-CoV-2 public health emergency.  Safety protocols were in place, including screening questions prior to the visit, additional usage of staff PPE, and extensive cleaning of exam room while observing appropriate contact time as indicated for disinfecting solutions.   CC: Back and neck pain follow-up  CXK:[id number]  Raymond Branch. is a 35 y.o. male coming in with complaint of back and neck pain. OMT 09/21/2020. Patient states has not been doing any exercises since we last saw him.  Patient was in the emergency room for more what seemed to be a viral gastritis.  Patient is making progress in returning but still was significantly fatigued for some time.  Patient has not been able to workout on a regular basis.  Also complaining of bilateral elbow pain over medial aspect for past 2 months. Paitent has not been doing any  Medications patient has been prescribed:           Reviewed prior external information including notes and imaging from previsou exam, outside providers and external EMR if available.   As well as notes that were available from care everywhere and other healthcare systems.  Past medical history, social, surgical and family history all reviewed in electronic medical record.  No pertanent information unless stated regarding to the chief complaint.   Past Medical History:  Diagnosis Date  . Crohn's colitis ([facility])   . Graves disease     No Known Allergies   Review of Systems:  No headache, visual changes,  vomiting, diarrhea, constipation, dizziness, abdominal pain, skin rash, fevers, chills, night sweats, weight loss, swollen lymph nodes, body aches, joint swelling, chest pain, shortness of breath, mood changes. POSITIVE muscle aches, body aches,  nausea  Objective  Blood pressure 106/82, pulse 86, height 5\' 11"  (1.803 m), weight 201 lb (91.2 kg), SpO2 98 %.   General: No apparent distress alert and oriented x3 mood and affect normal, dressed appropriately.  HEENT: Pupils equal, extraocular movements intact  Respiratory: Patient's speak in full sentences and does not appear short of breath  Cardiovascular: No lower extremity edema, non tender, no erythema  Neuro: Cranial nerves II through XII are intact, neurovascularly intact in all extremities with 2+ DTRs and 2+ pulses.  Gait normal with good balance and coordination.  MSK: Elbow pain is noted on the medial epicondylar region right greater than left.  Very minimal discomfort with flexion of the wrist against resistance.  Negative Tinel bilaterally.  Good grip strength  Low back exam shows tightness noted in the paraspinal musculature right greater than left mostly in the thoracolumbar as well as a little bit in the lumbosacral area.  Negative straight leg test.  Tightness noted in the hip flexor.   Osteopathic findings  C5 flexed rotated and side bent right T4 extended rotated and side bent left L1 flexed rotated and side bent right Sacrum right on right     Assessment and Plan:  Low back pain Still multifactorial.  Does have some discomfort noted on exam today.  Discussed icing regimen and home exercises.  Patient is not taking any other medicine for it.  We can consider it but patient wants to avoid anti-inflammatory secondary to Crohn's disease.  Patient will follow up with me again 4 to 6 weeks.    Nonallopathic problems  Decision  today to treat with OMT was based on Physical Exam  After verbal consent patient was treated with HVLA, ME, FPR techniques in cervical, thoracic, lumbar, and sacral  areas  Patient tolerated the procedure well with improvement in symptoms  Patient given exercises, stretches and lifestyle modifications  See medications in patient  instructions if given  Patient will follow up in 4-8 weeks      The above documentation has been reviewed and is accurate and complete [name], DO       Note: This dictation was prepared with Dragon dictation along with smaller phrase technology. Any transcriptional errors that result from this process are unintentional.

## 2020-11-11 ENCOUNTER — Other Ambulatory Visit: Payer: Self-pay

## 2020-11-11 ENCOUNTER — Ambulatory Visit: Payer: Managed Care, Other (non HMO) | Admitting: Family Medicine

## 2020-11-11 ENCOUNTER — Encounter: Payer: Self-pay | Admitting: Family Medicine

## 2020-11-11 VITALS — BP 106/82 | HR 86 | Ht 71.0 in | Wt 201.0 lb

## 2020-11-11 DIAGNOSIS — M545 Low back pain, unspecified: Secondary | ICD-10-CM | POA: Diagnosis not present

## 2020-11-11 DIAGNOSIS — M999 Biomechanical lesion, unspecified: Secondary | ICD-10-CM | POA: Diagnosis not present

## 2020-11-11 DIAGNOSIS — G8929 Other chronic pain: Secondary | ICD-10-CM | POA: Diagnosis not present

## 2020-11-11 NOTE — Patient Instructions (Signed)
See me again in 6 weeks Avoid putting elbows on hard surfaces No more underhand lifting Stay away from chin up

## 2020-11-11 NOTE — Assessment & Plan Note (Signed)
Still multifactorial.  Does have some discomfort noted on exam today.  Discussed icing regimen and home exercises.  Patient is not taking any other medicine for it.  We can consider it but patient wants to avoid anti-inflammatory secondary to Crohn's disease.  Patient will follow up with me again 4 to 6 weeks.

## 2020-12-26 ENCOUNTER — Ambulatory Visit: Payer: Managed Care, Other (non HMO) | Admitting: Family Medicine

## 2021-01-06 ENCOUNTER — Other Ambulatory Visit: Payer: Self-pay

## 2021-01-06 ENCOUNTER — Ambulatory Visit: Payer: Managed Care, Other (non HMO) | Admitting: Family Medicine

## 2021-01-06 ENCOUNTER — Encounter: Payer: Self-pay | Admitting: Family Medicine

## 2021-01-06 VITALS — BP 118/80 | HR 80 | Ht 71.0 in | Wt 197.0 lb

## 2021-01-06 DIAGNOSIS — M999 Biomechanical lesion, unspecified: Secondary | ICD-10-CM

## 2021-01-06 DIAGNOSIS — M545 Low back pain, unspecified: Secondary | ICD-10-CM | POA: Diagnosis not present

## 2021-01-06 DIAGNOSIS — K50919 Crohn's disease, unspecified, with unspecified complications: Secondary | ICD-10-CM

## 2021-01-06 DIAGNOSIS — K5090 Crohn's disease, unspecified, without complications: Secondary | ICD-10-CM | POA: Insufficient documentation

## 2021-01-06 DIAGNOSIS — G8929 Other chronic pain: Secondary | ICD-10-CM

## 2021-01-06 DIAGNOSIS — M2550 Pain in unspecified joint: Secondary | ICD-10-CM | POA: Diagnosis not present

## 2021-01-06 LAB — CBC WITH DIFFERENTIAL/PLATELET
Basophils Absolute: 0 10*3/uL (ref 0.0–0.1)
Basophils Relative: 0.3 % (ref 0.0–3.0)
Eosinophils Absolute: 0 10*3/uL (ref 0.0–0.7)
Eosinophils Relative: 0.1 % (ref 0.0–5.0)
HCT: 42.8 % (ref 39.0–52.0)
Hemoglobin: 14.8 g/dL (ref 13.0–17.0)
Lymphocytes Relative: 9.8 % — ABNORMAL LOW (ref 12.0–46.0)
Lymphs Abs: 1.1 10*3/uL (ref 0.7–4.0)
MCHC: 34.5 g/dL (ref 30.0–36.0)
MCV: 91.5 fl (ref 78.0–100.0)
Monocytes Absolute: 0.3 10*3/uL (ref 0.1–1.0)
Monocytes Relative: 2.8 % — ABNORMAL LOW (ref 3.0–12.0)
Neutro Abs: 9.5 10*3/uL — ABNORMAL HIGH (ref 1.4–7.7)
Neutrophils Relative %: 87 % — ABNORMAL HIGH (ref 43.0–77.0)
Platelets: 266 10*3/uL (ref 150.0–400.0)
RBC: 4.68 Mil/uL (ref 4.22–5.81)
RDW: 13.3 % (ref 11.5–15.5)
WBC: 10.9 10*3/uL — ABNORMAL HIGH (ref 4.0–10.5)

## 2021-01-06 LAB — TSH: TSH: 0.22 u[IU]/mL — ABNORMAL LOW (ref 0.35–4.50)

## 2021-01-06 LAB — COMPREHENSIVE METABOLIC PANEL
ALT: 35 U/L (ref 0–53)
AST: 12 U/L (ref 0–37)
Albumin: 4.4 g/dL (ref 3.5–5.2)
Alkaline Phosphatase: 73 U/L (ref 39–117)
BUN: 16 mg/dL (ref 6–23)
CO2: 32 mEq/L (ref 19–32)
Calcium: 9.7 mg/dL (ref 8.4–10.5)
Chloride: 102 mEq/L (ref 96–112)
Creatinine, Ser: 0.95 mg/dL (ref 0.40–1.50)
GFR: 103.02 mL/min (ref >60.00)
Glucose, Bld: 112 mg/dL — ABNORMAL HIGH (ref 70–99)
Potassium: 4.3 mEq/L (ref 3.5–5.1)
Sodium: 141 mEq/L (ref 135–145)
Total Bilirubin: 0.5 mg/dL (ref 0.2–1.2)
Total Protein: 7.3 g/dL (ref 6.0–8.3)

## 2021-01-06 LAB — SEDIMENTATION RATE: Sed Rate: 5 mm/hr (ref 0–15)

## 2021-01-06 LAB — IBC PANEL
Iron: 66 ug/dL (ref 42–165)
Saturation Ratios: 18.8 % — ABNORMAL LOW (ref 20.0–50.0)
Transferrin: 251 mg/dL (ref 212.0–360.0)

## 2021-01-06 LAB — VITAMIN B12: Vitamin B-12: 279 pg/mL (ref 211–911)

## 2021-01-06 LAB — VITAMIN D 25 HYDROXY (VIT D DEFICIENCY, FRACTURES): VITD: 24.03 ng/mL — ABNORMAL LOW (ref 30.00–100.00)

## 2021-01-06 NOTE — Assessment & Plan Note (Signed)
Patient does have some mild tightness still on the low back. Has responded relatively well though to osteopathic manipulation. Patient does have Crohn's and can be potentially having a flare. We will get labs evaluate if anything else can be changed. Patient will follow up with me again 4 to 6 weeks.

## 2021-01-06 NOTE — Patient Instructions (Addendum)
Good to see you Labs today Hydroxyzine may be an option for situation problems See me again in 6-8 weeks

## 2021-01-06 NOTE — Progress Notes (Signed)
[name] [address] [place] [address] Phone: [phone] Subjective:   I Raymond Branch am serving as a Neurosurgeon for Dr. Antoine Primas.  This visit occurred during the SARS-CoV-2 public health emergency.  Safety protocols were in place, including screening questions prior to the visit, additional usage of staff PPE, and extensive cleaning of exam room while observing appropriate contact time as indicated for disinfecting solutions.   I'm seeing this patient by the request  of:  Patient, No Pcp Per  CC: back pain follow up   [id number]  Raymond Branch. is a 35 y.o. male coming in with complaint of back and neck pain. OMT 11/21/2020. Patient states he is doing ok. Slept wrong on his neck and states he was sore for 3 day. No other issues. Patient otherwise unfortunately had more stomach pains and likely is having a flare of some of his other chronic problems. Recently has been on prednisone for the flare of his Crohn's disease. His Graves' disease has been fairly well controlled.  Medications patient has been prescribed: None  Taking:         Reviewed prior external information including notes and imaging from previsou exam, outside providers and external EMR if available.   As well as notes that were available from care everywhere and other healthcare systems.  Past medical history, social, surgical and family history all reviewed in electronic medical record.  No pertanent information unless stated regarding to the chief complaint.   Past Medical History:  Diagnosis Date  . Crohn's colitis (HCC)   . Graves disease     No Known Allergies   Review of Systems:  No headache, visual changes, nausea, vomiting,  constipation, dizziness, abdominal pain, skin rash, fevers, chills, night sweats, weight loss, swollen lymph nodes,joint swelling, chest pain, shortness of breath, mood changes. POSITIVE muscle aches, diarrhea, body  aches  Objective  Blood pressure 118/80, pulse 80, height 5\' 11"  (1.803 m), weight 197 lb (89.4 kg), SpO2 97 %.   General: No apparent distress alert and oriented x3 mood and affect normal, dressed appropriately.  HEENT: Pupils equal, extraocular movements intact  Respiratory: Patient's speak in full sentences and does not appear short of breath  Cardiovascular: No lower extremity edema, non tender, no erythema  Gait normal with good balance and coordination.  MSK:  Non tender with full range of motion and good stability and symmetric strength and tone of shoulders, elbows, wrist, hip, knee and ankles bilaterally.  Back - Normal skin, Spine with normal alignment and no deformity.  No tenderness to vertebral process palpation.  Paraspinous muscles are not tender and without spasm.   Range of motion is full at neck and lumbar sacral regions  Osteopathic findings  C2 flexed rotated and side bent right T9 extended rotated and side bent right L2 flexed rotated and side bent right Sacrum right on right       Assessment and Plan:  Low back pain Patient does have some mild tightness still on the low back. Has responded relatively well though to osteopathic manipulation. Patient does have Crohn's and can be potentially having a flare. We will get labs evaluate if anything else can be changed. Patient will follow up with me again 4 to 6 weeks.    Nonallopathic problems  Decision today to treat with OMT was based on Physical Exam  After verbal consent patient was treated with HVLA, ME, FPR techniques in cervical, rib, thoracic, lumbar, and sacral  areas  Patient tolerated the procedure well with improvement in symptoms  Patient given exercises, stretches and lifestyle modifications  See medications in patient instructions if given  Patient will follow up in 4-8 weeks      The above documentation has been reviewed and is accurate and complete Judi Saa, DO       Note:  This dictation was prepared with Dragon dictation along with smaller phrase technology. Any transcriptional errors that result from this process are unintentional.

## 2021-01-09 ENCOUNTER — Encounter: Payer: Self-pay | Admitting: Family Medicine

## 2021-01-10 ENCOUNTER — Other Ambulatory Visit: Payer: Self-pay

## 2021-01-10 MED ORDER — VITAMIN D (ERGOCALCIFEROL) 1.25 MG (50000 UNIT) PO CAPS: 50000.0000 [IU] | ORAL_CAPSULE | ORAL | 0 refills | Status: DC

## 2021-02-16 ENCOUNTER — Ambulatory Visit: Payer: Managed Care, Other (non HMO) | Admitting: Family Medicine

## 2021-03-07 ENCOUNTER — Encounter: Payer: Self-pay | Admitting: Family Medicine

## 2021-03-24 NOTE — Progress Notes (Signed)
[place] Phone: [phone] Subjective:   [name], am serving as a scribe for Dr. Hulan Saas. This visit occurred during the SARS-CoV-2 public health emergency.  Safety protocols were in place, including screening questions prior to the visit, additional usage of staff PPE, and extensive cleaning of exam room while observing appropriate contact time as indicated for disinfecting solutions.   I'm seeing this patient by the request  of:  Patient, No Pcp Per (Inactive)  CC: Low back pain follow-up  [id number]  Marvens Hollars. is a 35 y.o. male coming in with complaint of back and neck pain. OMT 01/06/2021. Patient states that he has not had any issues since last visit.   Medications patient has been prescribed: Vit D          Reviewed prior external information including notes and imaging from previsou exam, outside providers and external EMR if available.   As well as notes that were available from care everywhere and other healthcare systems.  Past medical history, social, surgical and family history all reviewed in electronic medical record.  No pertanent information unless stated regarding to the chief complaint.   Past Medical History:  Diagnosis Date  . Crohn's colitis ([place])   . Graves disease     No Known Allergies   Review of Systems:  No headache, visual changes, nausea, vomiting, diarrhea, constipation, dizziness, abdominal pain, skin rash, fevers, chills, night sweats, weight loss, swollen lymph nodes, body aches, joint swelling, chest pain, shortness of breath, mood changes. POSITIVE muscle aches  Objective  Blood pressure 118/86, pulse 82, height 5' 11"  (1.803 m), weight 194 lb (88 kg), SpO2 96 %.   General: No apparent distress alert and oriented x3 mood and affect normal, dressed appropriately.  HEENT: Pupils equal, extraocular movements intact  Respiratory: Patient's  speak in full sentences and does not appear short of breath  Cardiovascular: No lower extremity edema, non tender, no erythema  Low back exam does have some mild loss of lordosis.  Tightness noted more in the thoracolumbar juncture.  Mild pain over the sacroiliac joint.  Mild worsening pain of the lumbar spine with extension of the back.  5-5 strength all of the extremities.   Osteopathic findings  C5 flexed rotated and side bent left T5 extended rotated and side bent left L1 flexed rotated and side bent right Sacrum right on right       Assessment and Plan:  Low back pain Chronic pain with mild exacerbation.  Patient is doing a little better.  Patient continues on a once weekly vitamin D and will be getting it checked again in the next 6 to 12 weeks.  We discussed with patient that I do think that the Crohn's disease could be potentially playing a role as well.  Patient is going to continue with the Humira and hopefully patient will respond.  Follow-up with me again 6 to 8 weeks  Crohn's disease ([place]) Continuing to following up with gastroenterology and is on Humira.    Nonallopathic problems  Decision today to treat with OMT was based on Physical Exam  After verbal consent patient was treated with HVLA, ME, FPR techniques in cervical,  thoracic, lumbar, and sacral  areas  Patient tolerated the procedure well with improvement in symptoms  Patient given exercises, stretches and lifestyle modifications  See medications in patient instructions if given  Patient will follow up in 6 weeks  The above documentation has been reviewed and is accurate and complete [name], DO       Note: This dictation was prepared with Dragon dictation along with smaller phrase technology. Any transcriptional errors that result from this process are unintentional.

## 2021-03-28 ENCOUNTER — Other Ambulatory Visit: Payer: Self-pay

## 2021-03-28 ENCOUNTER — Ambulatory Visit (INDEPENDENT_AMBULATORY_CARE_PROVIDER_SITE_OTHER): Payer: Managed Care, Other (non HMO) | Admitting: Family Medicine

## 2021-03-28 ENCOUNTER — Encounter: Payer: Self-pay | Admitting: Family Medicine

## 2021-03-28 VITALS — BP 118/86 | HR 82 | Ht 71.0 in | Wt 194.0 lb

## 2021-03-28 DIAGNOSIS — M9903 Segmental and somatic dysfunction of lumbar region: Secondary | ICD-10-CM | POA: Diagnosis not present

## 2021-03-28 DIAGNOSIS — M9902 Segmental and somatic dysfunction of thoracic region: Secondary | ICD-10-CM

## 2021-03-28 DIAGNOSIS — K50919 Crohn's disease, unspecified, with unspecified complications: Secondary | ICD-10-CM

## 2021-03-28 DIAGNOSIS — M9901 Segmental and somatic dysfunction of cervical region: Secondary | ICD-10-CM | POA: Diagnosis not present

## 2021-03-28 DIAGNOSIS — M545 Low back pain, unspecified: Secondary | ICD-10-CM | POA: Diagnosis not present

## 2021-03-28 DIAGNOSIS — M9904 Segmental and somatic dysfunction of sacral region: Secondary | ICD-10-CM | POA: Diagnosis not present

## 2021-03-28 DIAGNOSIS — G8929 Other chronic pain: Secondary | ICD-10-CM

## 2021-03-28 NOTE — Assessment & Plan Note (Signed)
Chronic pain with mild exacerbation.  Patient is doing a little better.  Patient continues on a once weekly vitamin D and will be getting it checked again in the next 6 to 12 weeks.  We discussed with patient that I do think that the Crohn's disease could be potentially playing a role as well.  Patient is going to continue with the Humira and hopefully patient will respond.  Follow-up with me again 6 to 8 weeks

## 2021-03-28 NOTE — Assessment & Plan Note (Signed)
Continuing to following up with gastroenterology and is on Humira.

## 2021-03-28 NOTE — Patient Instructions (Signed)
Great to see you Glad you are better Consider labs next visit See me in 6 weeks

## 2021-04-01 ENCOUNTER — Other Ambulatory Visit: Payer: Self-pay | Admitting: Family Medicine

## 2021-05-15 NOTE — Progress Notes (Signed)
[facility] [address] [place] Phone: [phone] Subjective:   I Raymond Branch am serving as a Education administrator for Dr. Hulan Saas.  This visit occurred during the SARS-CoV-2 public health emergency.  Safety protocols were in place, including screening questions prior to the visit, additional usage of staff PPE, and extensive cleaning of exam room while observing appropriate contact time as indicated for disinfecting solutions.   I'm seeing this patient by the request  of:  Patient, No Pcp Per (Inactive)  CC: back and neck pain   [id number]  Raymond Branch. is a 35 y.o. male coming in with complaint of back and neck pain. OMT 03/28/2021. Patient states his neck and back is doing well. Left knee has been bothering him. States he has had muscle spasms in the left shoulder.   Medications patient has been prescribed: None          Reviewed prior external information including notes and imaging from previsou exam, outside providers and external EMR if available.   As well as notes that were available from care everywhere and other healthcare systems.  Past medical history, social, surgical and family history all reviewed in electronic medical record.  No pertanent information unless stated regarding to the chief complaint.   Past Medical History:  Diagnosis Date   Crohn's colitis ([place])    Graves disease     No Known Allergies   Review of Systems:  No headache, visual changes, nausea, vomiting, diarrhea, constipation, dizziness, abdominal pain, skin rash, fevers, chills, night sweats, weight loss, swollen lymph nodes, body aches, joint swelling, chest pain, shortness of breath, mood changes. POSITIVE muscle aches  Objective  Blood pressure 120/84, pulse 85, height 5' 11"  (1.803 m), weight 205 lb (93 kg), SpO2 98 %.   General: No apparent distress alert and oriented x3 mood and affect normal, dressed appropriately.  HEENT:  Pupils equal, extraocular movements intact  Respiratory: Patient's speak in full sentences and does not appear short of breath  Cardiovascular: No lower extremity edema, non tender, no erythema  Low back exam does have some mild loss of lordosis.  Tightness noted in the thoracolumbar juncture as well as somewhat in the inferior angle of the scapula right side. Left knee exam shows the patient is tender to palpation in the popliteal area.  Patient does have pain over the lateral aspect of the knee.  No significant crepitus noted.  No significant radicular symptoms with straight leg test.  Patient's patella seems to be fairly unremarkable.  Osteopathic findings  T8 extended rotated and side bent right L2 flexed rotated and side bent right Sacrum right on right       Assessment and Plan:  Popliteal pain We will continue to have difficulty with the popliteal tendon.  Discussed icing regimen and home exercises.  At this point with a continuing to be a recurrence patient will have an x-ray done.  Discussed potential compression sleeve and avoiding certain activities but is able to increase activity when he feels fit.  He has not been very active secondary to his Crohn's disease and changing of medications.  Follow-up with me again in 6 to 8 weeks  Low back pain Low back exam does have some mild loss of lordosis and some tenderness to palpation.  Patient does have tightness with FABER test bilaterally.  We will continue to monitor.  Responding well to manipulation.  Follow-up again in 6 weeks   Nonallopathic problems  Decision today to treat with OMT was based on Physical Exam  After verbal consent patient was treated with HVLA, ME, FPR techniques in thoracic, lumbar, and sacral  areas  Patient tolerated the procedure well with improvement in symptoms  Patient given exercises, stretches and lifestyle modifications  See medications in patient instructions if given  Patient will follow up  in 6 weeks     The above documentation has been reviewed and is accurate and complete [name], DO        Note: This dictation was prepared with Dragon dictation along with smaller phrase technology. Any transcriptional errors that result from this process are unintentional.

## 2021-05-18 ENCOUNTER — Ambulatory Visit: Payer: Managed Care, Other (non HMO) | Admitting: Family Medicine

## 2021-05-18 ENCOUNTER — Ambulatory Visit (INDEPENDENT_AMBULATORY_CARE_PROVIDER_SITE_OTHER): Payer: Managed Care, Other (non HMO)

## 2021-05-18 ENCOUNTER — Encounter: Payer: Self-pay | Admitting: Family Medicine

## 2021-05-18 ENCOUNTER — Other Ambulatory Visit: Payer: Self-pay

## 2021-05-18 VITALS — BP 120/84 | HR 85 | Ht 71.0 in | Wt 205.0 lb

## 2021-05-18 DIAGNOSIS — G8929 Other chronic pain: Secondary | ICD-10-CM

## 2021-05-18 DIAGNOSIS — M545 Low back pain, unspecified: Secondary | ICD-10-CM | POA: Diagnosis not present

## 2021-05-18 DIAGNOSIS — M25512 Pain in left shoulder: Secondary | ICD-10-CM

## 2021-05-18 DIAGNOSIS — M9902 Segmental and somatic dysfunction of thoracic region: Secondary | ICD-10-CM

## 2021-05-18 DIAGNOSIS — M79609 Pain in unspecified limb: Secondary | ICD-10-CM | POA: Diagnosis not present

## 2021-05-18 DIAGNOSIS — M9903 Segmental and somatic dysfunction of lumbar region: Secondary | ICD-10-CM | POA: Diagnosis not present

## 2021-05-18 DIAGNOSIS — M25562 Pain in left knee: Secondary | ICD-10-CM | POA: Diagnosis not present

## 2021-05-18 DIAGNOSIS — M9904 Segmental and somatic dysfunction of sacral region: Secondary | ICD-10-CM

## 2021-05-18 NOTE — Assessment & Plan Note (Signed)
Low back exam does have some mild loss of lordosis and some tenderness to palpation.  Patient does have tightness with FABER test bilaterally.  We will continue to monitor.  Responding well to manipulation.  Follow-up again in 6 weeks

## 2021-05-18 NOTE — Patient Instructions (Addendum)
Good to see you Xray left knee and shoulder  Exercises for the knee Consider a compression sleeve See me again in 6 weeks

## 2021-05-18 NOTE — Assessment & Plan Note (Signed)
We will continue to have difficulty with the popliteal tendon.  Discussed icing regimen and home exercises.  At this point with a continuing to be a recurrence patient will have an x-ray done.  Discussed potential compression sleeve and avoiding certain activities but is able to increase activity when he feels fit.  He has not been very active secondary to his Crohn's disease and changing of medications.  Follow-up with me again in 6 to 8 weeks

## 2021-06-27 NOTE — Progress Notes (Signed)
  [place] Phone: [phone] Subjective:   [name], am serving as a scribe for Dr. Hulan Saas. This visit occurred during the SARS-CoV-2 public health emergency.  Safety protocols were in place, including screening questions prior to the visit, additional usage of staff PPE, and extensive cleaning of exam room while observing appropriate contact time as indicated for disinfecting solutions.   I'm seeing this patient by the request  of:  Patient, No Pcp Per (Inactive)  CC: Back and neck pain   [id number]  Oronde Hallenbeck. is a 35 y.o. male coming in with complaint of back and neck pain Patient states that he is knee is the same as last visit. Not limiting patient. Is her for OMT today. Shoulder pain has dissipated.           Reviewed prior external information including notes and imaging from previsou exam, outside providers and external EMR if available.   As well as notes that were available from care everywhere and other healthcare systems.  Past medical history, social, surgical and family history all reviewed in electronic medical record.  No pertanent information unless stated regarding to the chief complaint.   Past Medical History:  Diagnosis Date   Crohn's colitis ([place])    Graves disease     No Known Allergies   Review of Systems:  No headache, visual changes, nausea, vomiting, diarrhea, constipation, dizziness, abdominal pain, skin rash, fevers, chills, night sweats, weight loss, swollen lymph nodes, body aches, joint swelling, chest pain, shortness of breath, mood changes. POSITIVE muscle aches  Objective  Blood pressure 114/72, pulse 84, height 5' 11"  (1.803 m), weight 206 lb (93.4 kg).   General: No apparent distress alert and oriented x3 mood and affect normal, dressed appropriately.  HEENT: Pupils equal, extraocular movements intact  Respiratory: Patient's speak in full  sentences and does not appear short of breath  Cardiovascular: No lower extremity edema, non tender, no erythema  Low back exam show loss of lordosis tightness noted lumbar positive Teena Irani SLT  Osteopathic findings   T9 extended rotated and side bent left L2 flexed rotated and side bent right Sacrum right on right       Assessment and Plan:  Low back pain Low back still tight Tightness noted low back HEP reiterated.  Discussed meds and vitamins  RTC in 8 weeks   Nonallopathic problems  Decision today to treat with OMT was based on Physical Exam  After verbal consent patient was treated with HVLA, ME, FPR techniques in thoracic, lumbar, and sacral  areas  Patient tolerated the procedure well with improvement in symptoms  Patient given exercises, stretches and lifestyle modifications  See medications in patient instructions if given  Patient will follow up in 4-8 weeks      The above documentation has been reviewed and is accurate and complete [name], DO        Note: This dictation was prepared with Dragon dictation along with smaller phrase technology. Any transcriptional errors that result from this process are unintentional.

## 2021-06-28 ENCOUNTER — Ambulatory Visit (INDEPENDENT_AMBULATORY_CARE_PROVIDER_SITE_OTHER): Payer: Managed Care, Other (non HMO) | Admitting: Family Medicine

## 2021-06-28 ENCOUNTER — Other Ambulatory Visit: Payer: Self-pay

## 2021-06-28 ENCOUNTER — Ambulatory Visit: Payer: Self-pay

## 2021-06-28 VITALS — BP 114/72 | HR 84 | Ht 71.0 in | Wt 206.0 lb

## 2021-06-28 DIAGNOSIS — M9903 Segmental and somatic dysfunction of lumbar region: Secondary | ICD-10-CM | POA: Diagnosis not present

## 2021-06-28 DIAGNOSIS — M9904 Segmental and somatic dysfunction of sacral region: Secondary | ICD-10-CM | POA: Diagnosis not present

## 2021-06-28 DIAGNOSIS — M9902 Segmental and somatic dysfunction of thoracic region: Secondary | ICD-10-CM

## 2021-06-28 DIAGNOSIS — G8929 Other chronic pain: Secondary | ICD-10-CM

## 2021-06-28 DIAGNOSIS — M25512 Pain in left shoulder: Secondary | ICD-10-CM | POA: Diagnosis not present

## 2021-06-28 DIAGNOSIS — M545 Low back pain, unspecified: Secondary | ICD-10-CM

## 2021-06-28 NOTE — Patient Instructions (Signed)
See me in 6-8 weeks

## 2021-06-29 ENCOUNTER — Encounter: Payer: Self-pay | Admitting: Family Medicine

## 2021-06-29 ENCOUNTER — Other Ambulatory Visit: Payer: Self-pay | Admitting: Family Medicine

## 2021-06-29 NOTE — Assessment & Plan Note (Signed)
Low back still tight Tightness noted low back HEP reiterated.  Discussed meds and vitamins  RTC in 8 weeks

## 2021-08-23 ENCOUNTER — Ambulatory Visit (INDEPENDENT_AMBULATORY_CARE_PROVIDER_SITE_OTHER): Payer: Managed Care, Other (non HMO) | Admitting: Family Medicine

## 2021-08-23 ENCOUNTER — Ambulatory Visit: Payer: Self-pay

## 2021-08-23 ENCOUNTER — Other Ambulatory Visit: Payer: Self-pay

## 2021-08-23 VITALS — BP 110/80 | HR 82 | Ht 71.0 in | Wt 206.0 lb

## 2021-08-23 DIAGNOSIS — M9904 Segmental and somatic dysfunction of sacral region: Secondary | ICD-10-CM

## 2021-08-23 DIAGNOSIS — M9901 Segmental and somatic dysfunction of cervical region: Secondary | ICD-10-CM | POA: Diagnosis not present

## 2021-08-23 DIAGNOSIS — M79672 Pain in left foot: Secondary | ICD-10-CM

## 2021-08-23 DIAGNOSIS — M9902 Segmental and somatic dysfunction of thoracic region: Secondary | ICD-10-CM

## 2021-08-23 DIAGNOSIS — M9903 Segmental and somatic dysfunction of lumbar region: Secondary | ICD-10-CM

## 2021-08-23 DIAGNOSIS — M545 Low back pain, unspecified: Secondary | ICD-10-CM

## 2021-08-23 DIAGNOSIS — M71371 Other bursal cyst, right ankle and foot: Secondary | ICD-10-CM | POA: Diagnosis not present

## 2021-08-23 DIAGNOSIS — G8929 Other chronic pain: Secondary | ICD-10-CM

## 2021-08-23 NOTE — Progress Notes (Signed)
[place] Phone: [phone] Subjective:   Raymond Branch, am serving as a scribe for Dr. Hulan Saas. This visit occurred during the SARS-CoV-2 public health emergency.  Safety protocols were in place, including screening questions prior to the visit, additional usage of staff PPE, and extensive cleaning of exam room while observing appropriate contact time as indicated for disinfecting solutions.   I'm seeing this patient by the request  of:  Rankins, [name], MD  CC: Low back pain follow-up  [id number]  06/28/2021 Low back still tight Tightness noted low back HEP reiterated.  Discussed meds and vitamins  RTC in 8 weeks  Updated 08/23/2021 Raymond Branch. is a 35 y.o. male coming in with complaint of LBP. States that he has been doing fine since last visit. Branch new pain in spine.   Occasionally has pain at base of 5th met in L foot. Branch pain unless he hits it on something or pushes off forcefully.  Also has cyst in between great toe and second toe. Branch pain but notices deviation of toes and callus under foot.      Past Medical History:  Diagnosis Date   Crohn's colitis ([place])    Graves disease    Past Surgical History:  Procedure Laterality Date   Branch PAST SURGERIES     Social History   Socioeconomic History   Marital status: Married    Spouse name: Not on file   Number of children: Not on file   Years of education: Not on file   Highest education level: Not on file  Occupational History   Not on file  Tobacco Use   Smoking status: Never   Smokeless tobacco: Never  Vaping Use   Vaping Use: Never used  Substance and Sexual Activity   Alcohol use: Yes   Drug use: Never   Sexual activity: Not Currently  Other Topics Concern   Not on file  Social History Narrative   Not on file   Social Determinants of Health   Financial Resource Strain: Not on file  Food Insecurity: Not on  file  Transportation Needs: Not on file  Physical Activity: Not on file  Stress: Not on file  Social Connections: Not on file   Branch Known Allergies Branch family history on file.       Current Outpatient Medications (Other):    Vitamin D, Ergocalciferol, (DRISDOL) 1.25 MG (50000 UNIT) CAPS capsule, TAKE ONE CAPSULE BY MOUTH ONCE WEEKLY   Reviewed prior external information including notes and imaging from  primary care provider As well as notes that were available from care everywhere and other healthcare systems.  Past medical history, social, surgical and family history all reviewed in electronic medical record.  Branch pertanent information unless stated regarding to the chief complaint.   Review of Systems:  Branch headache, visual changes, nausea, vomiting, diarrhea, constipation, dizziness, abdominal pain, skin rash, fevers, chills, night sweats, weight loss, swollen lymph nodes, body aches, joint swelling, chest pain, shortness of breath, mood changes. POSITIVE muscle aches  Objective  Blood pressure 110/80, pulse 82, height 5' 11"  (1.803 m), weight 206 lb (93.4 kg), SpO2 97 %.   General: Branch apparent distress alert and oriented x3 mood and affect normal, dressed appropriately.  HEENT: Pupils equal, extraocular movements intact  Respiratory: Patient's speak in full sentences and does not appear short of breath  Cardiovascular: Branch lower extremity edema, non tender, Branch erythema  Gait normal with good balance and coordination.  MSK:   Neck exam shows mild breakdown noted of the tightness noted bilaterally.  Low back exam does have tightness noted in the paraspinal musculature of the lumbar spine right greater than left as well.  Osteopathic findings C3 flexed rotated and side bent right C6 flexed rotated and side bent left T6 extended rotated and side bent left L2 flexed rotated and side bent right Sacrum right on right     Impression and Recommendations:     The above  documentation has been reviewed and is accurate and complete Raymond Pulley, DO

## 2021-08-23 NOTE — Assessment & Plan Note (Signed)
Patient has a cyst noted.  Does not seem to be a potentially neuroma.  Seems to be more of a soft tissue mass.  Mild abnormal vascularity surrounding the area.  Patient is already seen a Psychologist, sport and exercise and is likely having it removed in the near future.  No fevers or chills or any abnormal weight loss.

## 2021-08-23 NOTE — Patient Instructions (Signed)
[facility] See me again in 6 weeks

## 2021-08-23 NOTE — Assessment & Plan Note (Signed)
Chronic, with mild exacerbation.  Discussed with patient icing regimen and home exercises.  Patient has been taking the vitamin D.  Patient will be having potential for surgery but I do think would potentially actually cause some exacerbation of the back pain and may need to see him on a more regular basis.  Patient will follow up with me again in 4 to 6 weeks.

## 2021-09-18 ENCOUNTER — Other Ambulatory Visit: Payer: Self-pay | Admitting: Family Medicine

## 2021-10-03 NOTE — Progress Notes (Signed)
[place] Phone: [phone] Subjective:    I'm seeing this patient by the request  of:  Rankins, [name], MD  CC: Left foot pain, back pain  [id number]  08/23/2021 Patient has a cyst noted.  Does not seem to be a potentially neuroma.  Seems to be more of a soft tissue mass.  Mild abnormal vascularity surrounding the area.  Patient is already seen a Psychologist, sport and exercise and is likely having it removed in the near future.  No fevers or chills or any abnormal weight loss.  Chronic, with mild exacerbation.  Discussed with patient icing regimen and home exercises.  Patient has been taking the vitamin D.  Patient will be having potential for surgery but I do think would potentially actually cause some exacerbation of the back pain and may need to see him on a more regular basis.  Patient will follow up with me again in 4 to 6 weeks.  Updated 10/04/2021 Raymond Branch. is a 35 y.o. male coming in with complaint of left foot pain. No changes. Same pain.  Regarding the foot patient is going to have it removed at a later date.       Past Medical History:  Diagnosis Date   Crohn's colitis ([place])    Graves disease    Past Surgical History:  Procedure Laterality Date   NO PAST SURGERIES     Social History   Socioeconomic History   Marital status: Married    Spouse name: Not on file   Number of children: Not on file   Years of education: Not on file   Highest education level: Not on file  Occupational History   Not on file  Tobacco Use   Smoking status: Never   Smokeless tobacco: Never  Vaping Use   Vaping Use: Never used  Substance and Sexual Activity   Alcohol use: Yes   Drug use: Never   Sexual activity: Not Currently  Other Topics Concern   Not on file  Social History Narrative   Not on file   Social Determinants of Health   Financial Resource Strain: Not on file  Food Insecurity: Not on file   Transportation Needs: Not on file  Physical Activity: Not on file  Stress: Not on file  Social Connections: Not on file   No Known Allergies No family history on file.       Current Outpatient Medications (Other):    Vitamin D, Ergocalciferol, (DRISDOL) 1.25 MG (50000 UNIT) CAPS capsule, TAKE ONE CAPSULE BY MOUTH ONCE WEEKLY   Reviewed prior external information including notes and imaging from  primary care provider As well as notes that were available from care everywhere and other healthcare systems.  Past medical history, social, surgical and family history all reviewed in electronic medical record.  No pertanent information unless stated regarding to the chief complaint.   Review of Systems:  No headache, visual changes, nausea, vomiting, diarrhea, constipation, dizziness, abdominal pain, skin rash, fevers, chills, night sweats, weight loss, swollen lymph nodes, body aches, joint swelling, chest pain, shortness of breath, mood changes. POSITIVE muscle aches  Objective  Blood pressure 108/70, pulse 97, height 5' 11"  (1.803 m), weight 205 lb (93 kg), SpO2 99 %.   General: No apparent distress alert and oriented x3 mood and affect normal, dressed appropriately.  HEENT: Pupils equal, extraocular movements intact  Respiratory: Patient's speak in full sentences and does not appear short of breath  Cardiovascular: No lower extremity edema, non tender, no erythema  Gait normal with good balance and coordination.  MSK: Neck exam does have some mild loss of lordosis.  Tightness noted of the paraspinal musculature.  No negative Spurling's.  Patient does have tightness noted of the lumbar spine right greater than left as well.  This seems to be more over the right sacroiliac joint  Osteopathic findings  C2 flexed rotated and side bent right C5 flexed rotated and side bent left C6 flexed rotated and side bent left T3 extended rotated and side bent right inhaled third rib T6  extended rotated and side bent left L2 flexed rotated and side bent right Sacrum right on right     Impression and Recommendations:     The above documentation has been reviewed and is accurate and complete [name], DO

## 2021-10-04 ENCOUNTER — Ambulatory Visit (INDEPENDENT_AMBULATORY_CARE_PROVIDER_SITE_OTHER): Payer: Managed Care, Other (non HMO) | Admitting: Family Medicine

## 2021-10-04 ENCOUNTER — Other Ambulatory Visit: Payer: Self-pay

## 2021-10-04 VITALS — BP 108/70 | HR 97 | Ht 71.0 in | Wt 205.0 lb

## 2021-10-04 DIAGNOSIS — M9904 Segmental and somatic dysfunction of sacral region: Secondary | ICD-10-CM

## 2021-10-04 DIAGNOSIS — M9903 Segmental and somatic dysfunction of lumbar region: Secondary | ICD-10-CM

## 2021-10-04 DIAGNOSIS — M999 Biomechanical lesion, unspecified: Secondary | ICD-10-CM | POA: Diagnosis not present

## 2021-10-04 DIAGNOSIS — M9908 Segmental and somatic dysfunction of rib cage: Secondary | ICD-10-CM | POA: Diagnosis not present

## 2021-10-04 DIAGNOSIS — M545 Low back pain, unspecified: Secondary | ICD-10-CM | POA: Diagnosis not present

## 2021-10-04 DIAGNOSIS — G8929 Other chronic pain: Secondary | ICD-10-CM

## 2021-10-04 DIAGNOSIS — M9902 Segmental and somatic dysfunction of thoracic region: Secondary | ICD-10-CM

## 2021-10-04 DIAGNOSIS — M9901 Segmental and somatic dysfunction of cervical region: Secondary | ICD-10-CM

## 2021-10-04 NOTE — Assessment & Plan Note (Signed)

## 2021-10-04 NOTE — Assessment & Plan Note (Signed)
Continues to have some tightness in the lower back.  Responding well though to osteopathic manipulation.  Continue the vitamin D supplementation.  No change in management and follow-up again in 2 months

## 2021-10-04 NOTE — Patient Instructions (Signed)
Good to see you! Happy Holidays Tell everyone hi See you again in 6-8 weeks

## 2021-11-21 NOTE — Progress Notes (Signed)
[place] Phone: [phone] Subjective:   [name], am serving as a scribe for Dr. Hulan Saas. This visit occurred during the SARS-CoV-2 public health emergency.  Safety protocols were in place, including screening questions prior to the visit, additional usage of staff PPE, and extensive cleaning of exam room while observing appropriate contact time as indicated for disinfecting solutions.  I'm seeing this patient by the request  of:  Rankins, Raymond Salinas, MD  CC: Low back pain follow-up  [id number]  Brint Rebich. is a 36 y.o. male coming in with complaint of back and neck pain. OMT on 10/04/2021. Patient states overall doing relatively well.  Mild tightness but nothing severe.  Nothing that is stopping him from activity.  Patient states that he does have a dull, throbbing pain with certain activities.  He states that abdominal pain has been improved since taking the vitamin C regularly.  Medications patient has been prescribed: Vit D  Taking:         Reviewed prior external information including notes and imaging from previsou exam, outside providers and external EMR if available.   As well as notes that were available from care everywhere and other healthcare systems.  Past medical history, social, surgical and family history all reviewed in electronic medical record.  No pertanent information unless stated regarding to the chief complaint.   Past Medical History:  Diagnosis Date   Crohn's colitis ([place])    Graves disease     No Known Allergies   Review of Systems:  No headache, visual changes, nausea, vomiting, diarrhea, constipation, dizziness, abdominal pain, skin rash, fevers, chills, night sweats, weight loss, swollen lymph nodes, body aches, joint swelling, chest pain, shortness of breath, mood changes. POSITIVE muscle aches  Objective  Blood pressure 110/70, pulse (!) 120,  height 5\' 11"  (1.803 m), weight 202 lb (91.6 kg), SpO2 96 %.   General: No apparent distress alert and oriented x3 mood and affect normal, dressed appropriately.  HEENT: Pupils equal, extraocular movements intact  Respiratory: Patient's speak in full sentences and does not appear short of breath  Cardiovascular: No lower extremity edema, non tender, no erythema  Low back exam mild loss of lordosis.  Tightness noted around the right sacroiliac joint.  Negative FABER test.  Tightness in the right parascapular region noted.  Neck exam also some mild tightness on the right side but nothing severe.  Negative Spurling's.  5 out of 5 strength of all extremities  Osteopathic findings  C2 flexed rotated and side bent right T3 extended rotated and side bent right inhaled rib T9 extended rotated and side bent left L2 flexed rotated and side bent right L4 flexed rotated and side bent left Sacrum right on right       Assessment and Plan:  Low back pain Chronic, stable.  Responding relatively well.  Patient has been taking vitamin C recently and abdominal pain has improved as well.  Discussed icing regimen and home exercises.  Increase activity slowly and follow-up again in 6-8 weeks     Nonallopathic problems  Decision today to treat with OMT was based on Physical Exam  After verbal consent patient was treated with HVLA, ME, FPR techniques in cervical, rib, thoracic, lumbar, and sacral  areas  Patient tolerated the procedure well with improvement in symptoms  Patient given exercises, stretches and lifestyle modifications  See medications in patient instructions if given  Patient will follow  up in 4-8 weeks      The above documentation has been reviewed and is accurate and complete [name], DO        Note: This dictation was prepared with Dragon dictation along with smaller phrase technology. Any transcriptional errors that result from this process are unintentional.

## 2021-11-22 ENCOUNTER — Ambulatory Visit (INDEPENDENT_AMBULATORY_CARE_PROVIDER_SITE_OTHER): Payer: Managed Care, Other (non HMO) | Admitting: Family Medicine

## 2021-11-22 ENCOUNTER — Other Ambulatory Visit: Payer: Self-pay

## 2021-11-22 ENCOUNTER — Encounter: Payer: Self-pay | Admitting: Family Medicine

## 2021-11-22 VITALS — BP 110/70 | HR 120 | Ht 71.0 in | Wt 202.0 lb

## 2021-11-22 DIAGNOSIS — M9908 Segmental and somatic dysfunction of rib cage: Secondary | ICD-10-CM

## 2021-11-22 DIAGNOSIS — M9901 Segmental and somatic dysfunction of cervical region: Secondary | ICD-10-CM

## 2021-11-22 DIAGNOSIS — M545 Low back pain, unspecified: Secondary | ICD-10-CM | POA: Diagnosis not present

## 2021-11-22 DIAGNOSIS — M9904 Segmental and somatic dysfunction of sacral region: Secondary | ICD-10-CM

## 2021-11-22 DIAGNOSIS — M9902 Segmental and somatic dysfunction of thoracic region: Secondary | ICD-10-CM

## 2021-11-22 DIAGNOSIS — M9903 Segmental and somatic dysfunction of lumbar region: Secondary | ICD-10-CM | POA: Diagnosis not present

## 2021-11-22 DIAGNOSIS — G8929 Other chronic pain: Secondary | ICD-10-CM

## 2021-11-22 NOTE — Patient Instructions (Addendum)
See me again in 8 weeks Hope swim goes well

## 2021-11-22 NOTE — Assessment & Plan Note (Signed)
Chronic, stable.  Responding relatively well.  Patient has been taking vitamin C recently and abdominal pain has improved as well.  Discussed icing regimen and home exercises.  Increase activity slowly and follow-up again in 6-8 weeks

## 2021-12-06 IMAGING — CT CT ABD-PELV W/ CM
2 of 5 series · 16 of 46 positions shown, 18 images · IV contrast (Omni 300)
Comparison: None.

CLINICAL DATA: Abdominal pain.

EXAM:
CT ABDOMEN AND PELVIS WITH CONTRAST
TECHNIQUE: Multidetector CT imaging of the abdomen and pelvis was performed
using the standard protocol following bolus administration of
intravenous contrast.
CONTRAST:  100mL OMNIPAQUE IOHEXOL 300 MG/ML  SOLN

[Series 3: a/p w/ 5mm · axial · 0.80mm/px · z∈[+819,+1284]mm · 13 of 105 slices shown, 15 images]
[im 6/105  soft-tissue]
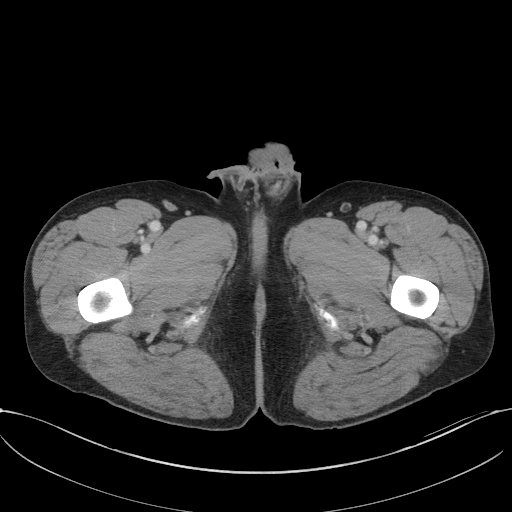
[im 6/105  bone]
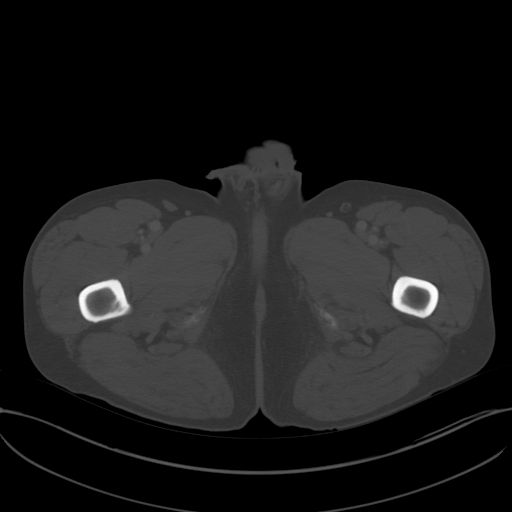
[im 17/105  soft-tissue]
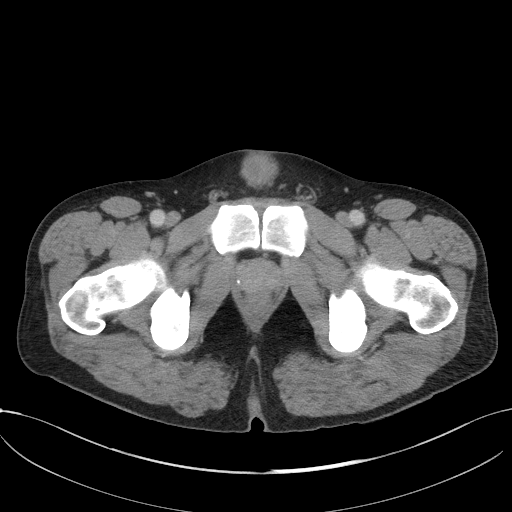
[im 22/105  soft-tissue]
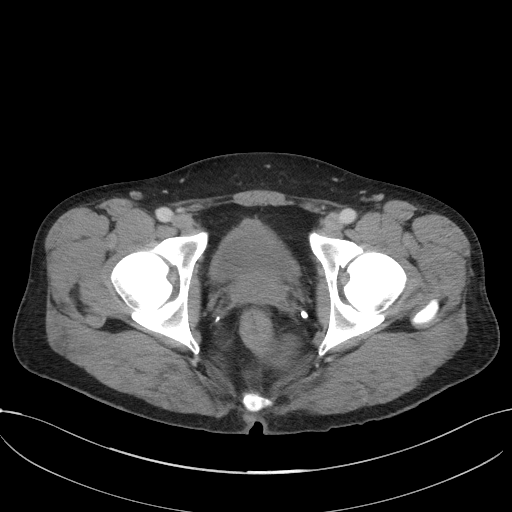
[im 28/105  soft-tissue]
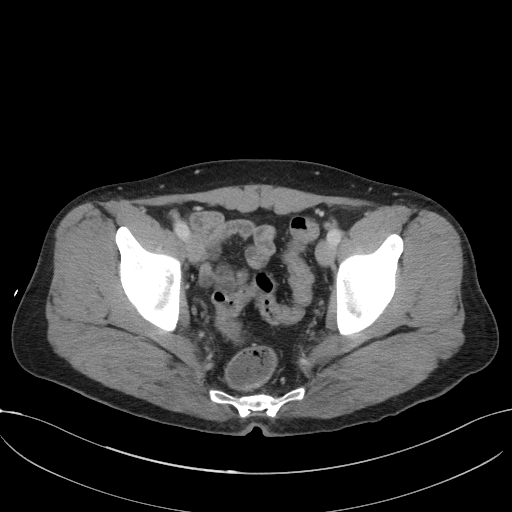
[im 39/105  soft-tissue]
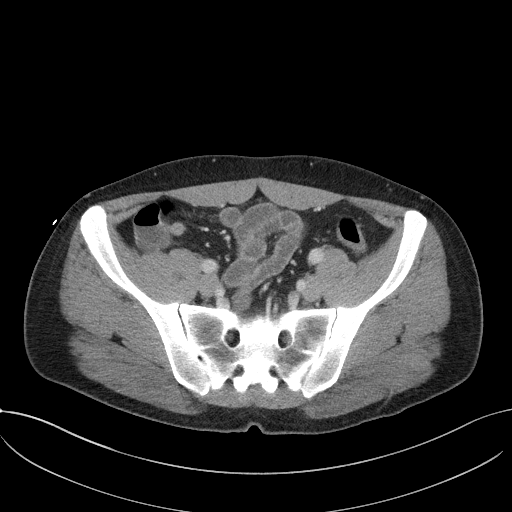
[im 44/105  soft-tissue]
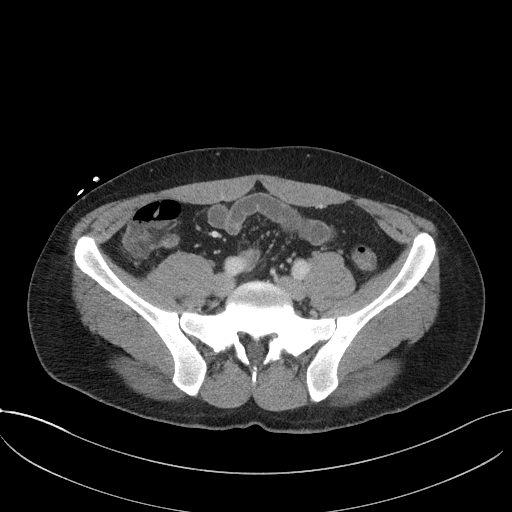
[im 55/105  soft-tissue]
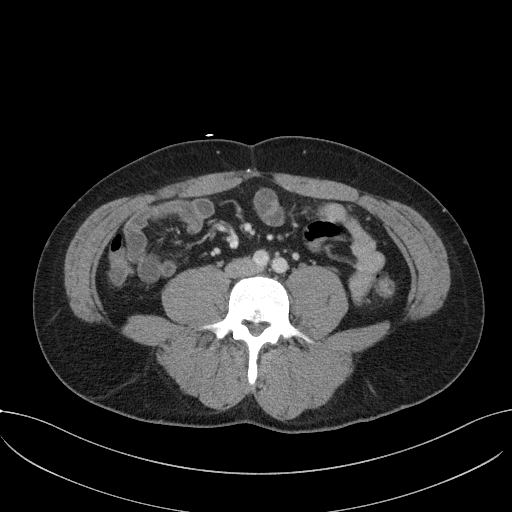
[im 61/105  soft-tissue]
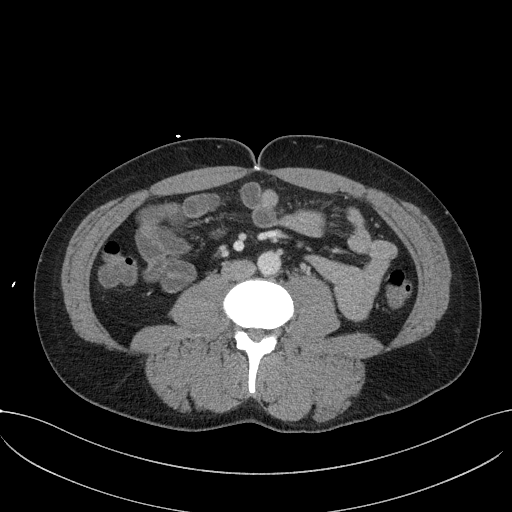
[im 66/105  soft-tissue]
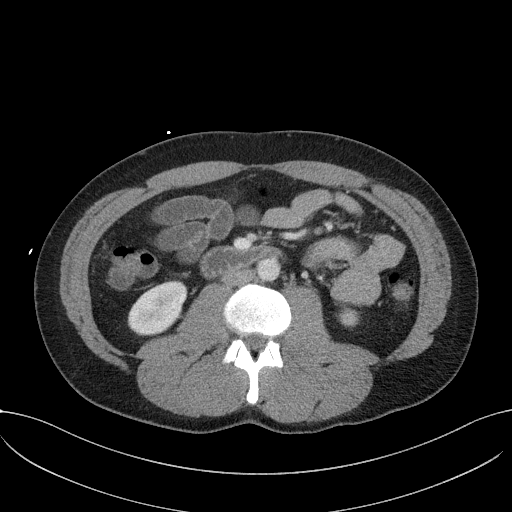
[im 66/105  bone]
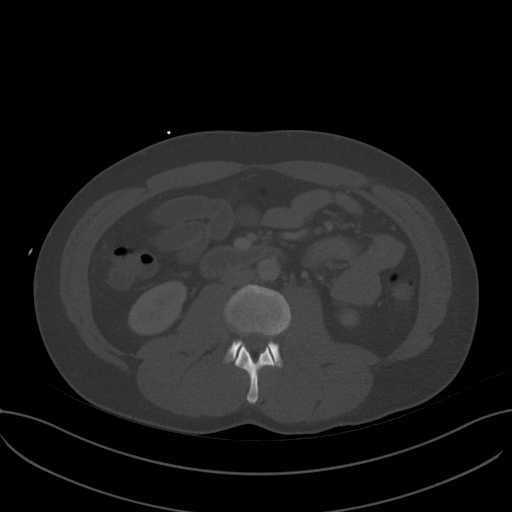
[im 77/105  soft-tissue]
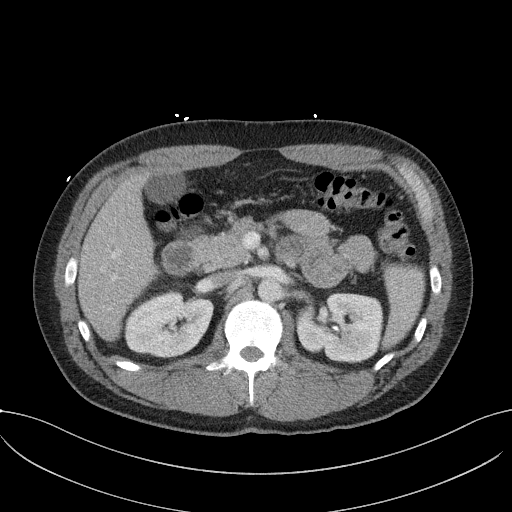
[im 83/105  soft-tissue]
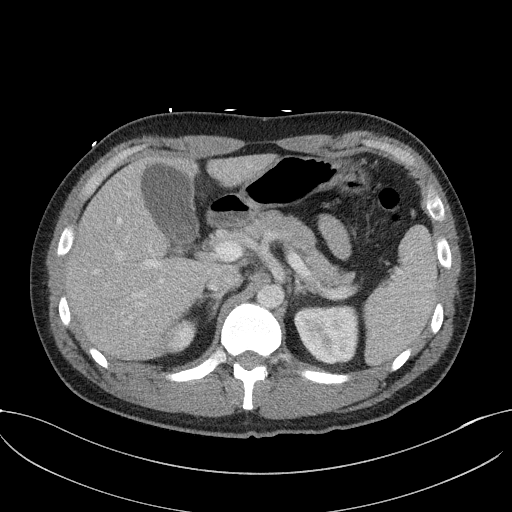
[im 88/105  soft-tissue]
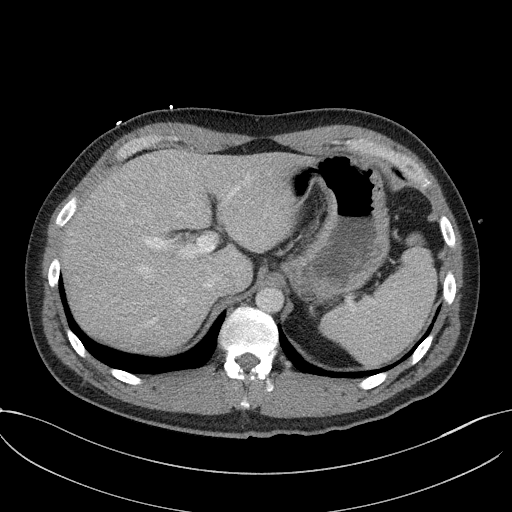
[im 99/105  soft-tissue]
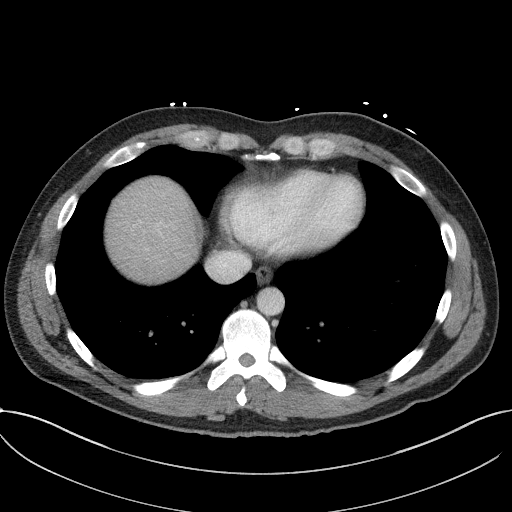

[Series 5: a/p w/ cor · coronal · 0.87mm/px · 3 of 138 slices shown]
[im 46/138  soft-tissue]
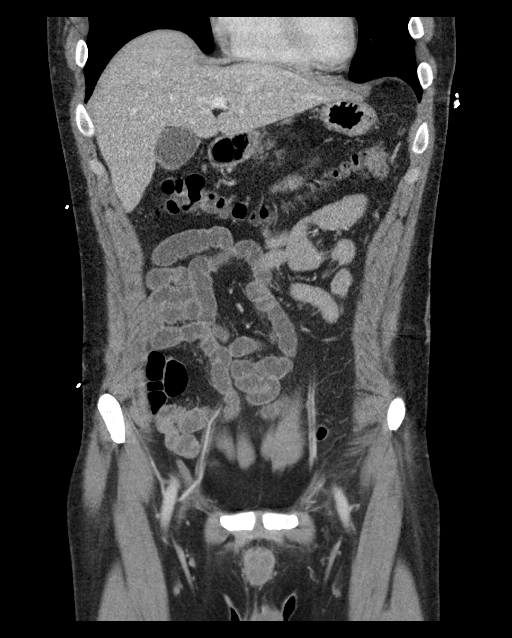
[im 61/138  soft-tissue]
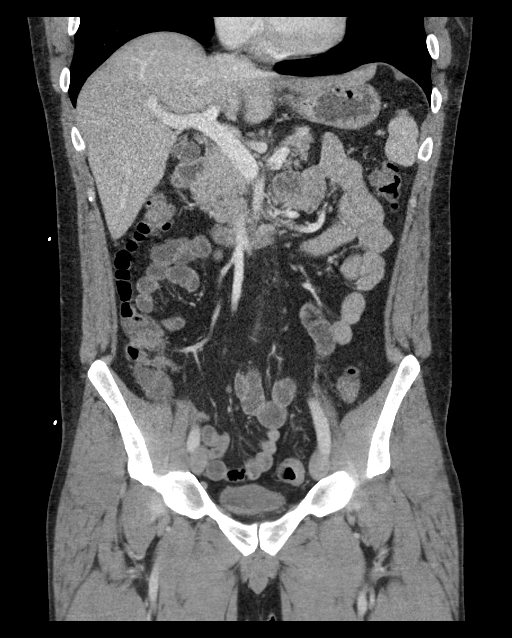
[im 77/138  soft-tissue]
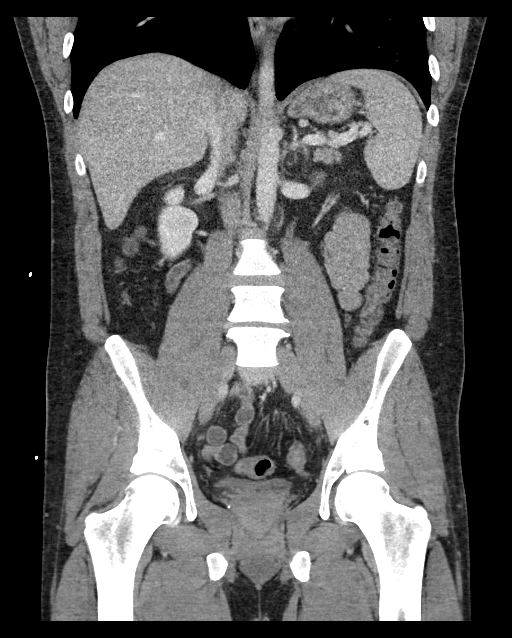

[16 of 46 positions shown; findings below may reference images not displayed]

FINDINGS: Lower chest: No acute abnormality.

Hepatobiliary: No focal liver abnormality is seen. No gallstones,
gallbladder wall thickening, or biliary dilatation.

Pancreas: Unremarkable. No pancreatic ductal dilatation or
surrounding inflammatory changes.

Spleen: Normal in size without focal abnormality.

Adrenals/Urinary Tract: Adrenal glands are unremarkable. Kidneys are
normal, without renal calculi, focal lesion, or hydronephrosis.
Bladder is unremarkable.

Stomach/Bowel: Stomach is within normal limits. Appendix appears
normal. No evidence of bowel wall thickening, distention, or
inflammatory changes.

Vascular/Lymphatic: No significant vascular findings are present. No
enlarged abdominal or pelvic lymph nodes.

Reproductive: Prostate is unremarkable.

Other: No abdominal wall hernia or abnormality. No abdominopelvic
ascites.

Musculoskeletal: No acute or significant osseous findings.
IMPRESSION: No CT evidence of acute intra-abdominal pathology.

## 2021-12-10 ENCOUNTER — Other Ambulatory Visit: Payer: Self-pay | Admitting: Family Medicine

## 2021-12-12 ENCOUNTER — Other Ambulatory Visit: Payer: Self-pay | Admitting: Otolaryngology

## 2022-01-16 NOTE — Progress Notes (Signed)
?  Charlann Boxer D.O. ?[facility] ?[place] ?Phone: [phone] ?Subjective:   ?I, Jacqualin Combes, am serving as a scribe for Dr. Hulan Saas. ? ?This visit occurred during the SARS-CoV-2 public health emergency.  Safety protocols were in place, including screening questions prior to the visit, additional usage of staff PPE, and extensive cleaning of exam room while observing appropriate contact time as indicated for disinfecting solutions.  ? ?I'm seeing this patient by the request  of:  Rankins, [name], MD ? ?CC: Low back and neck pain follow-up ? ?[id number]  ?Jcion Buddenhagen. is a 36 y.o. male coming in with complaint of back and neck pain. OMT 11/22/2021. Patient states that he had a mass removed on February 7th. Pain has subsided.  ? ?Medications patient has been prescribed: Vit D ? ?Taking: ? ? ?  ? ? ? ? ? ?Past Medical History:  ?Diagnosis Date  ? Crohn's colitis ([place])   ? Graves disease   ?  ?No Known Allergies ? ? ? ?Objective  ?Blood pressure 112/84, pulse 95, height 5' 11"  (1.803 m), weight 202 lb (91.6 kg), SpO2 97 %. ?  ?General: No apparent distress alert and oriented x3 mood and affect normal, dressed appropriately.  ?HEENT: Pupils equal, extraocular movements intact  ?Respiratory: Patient's speak in full sentences and does not appear short of breath  ?Back exam shows mild tightness noted in the paraspinal musculature at the thoracolumbar junction still over the right sacroiliac joint.  Mild positive Corky Sox on the right side.  No problems significant.  Negative straight leg test. ? ?Osteopathic findings ? ?C2 flexed rotated and side bent right ?C5 flexed rotated and side bent left ?T8 extended rotated and side bent left ?L2 flexed rotated and side bent right ?Sacrum right on right ? ? ? ? ?  ?Assessment and Plan: ? ?Low back pain ?Patient is doing relatively well at this point.  Still responding extremely well though to osteopathic manipulation.   Discussed icing regimen and home exercises.  Patient has not had to use any significant medications at this time.  Follow-up with me again in 8 weeks ?  ? ?Nonallopathic problems ? ?Decision today to treat with OMT was based on Physical Exam ? ?After verbal consent patient was treated with HVLA, ME, FPR techniques in cervical, , thoracic, lumbar, and sacral  areas ? ?Patient tolerated the procedure well with improvement in symptoms ? ?Patient given exercises, stretches and lifestyle modifications ? ?See medications in patient instructions if given ? ?Patient will follow up in 8 weeks ? ?  ? ? ?The above documentation has been reviewed and is accurate and complete [name], DO ? ? ? ?  ? ? Note: This dictation was prepared with Dragon dictation along with smaller phrase technology. Any transcriptional errors that result from this process are unintentional.    ?  ?  ? ?

## 2022-01-17 ENCOUNTER — Other Ambulatory Visit: Payer: Self-pay

## 2022-01-17 ENCOUNTER — Ambulatory Visit (INDEPENDENT_AMBULATORY_CARE_PROVIDER_SITE_OTHER): Payer: Managed Care, Other (non HMO) | Admitting: Family Medicine

## 2022-01-17 ENCOUNTER — Encounter: Payer: Self-pay | Admitting: Family Medicine

## 2022-01-17 VITALS — BP 112/84 | HR 95 | Ht 71.0 in | Wt 202.0 lb

## 2022-01-17 DIAGNOSIS — M9902 Segmental and somatic dysfunction of thoracic region: Secondary | ICD-10-CM | POA: Diagnosis not present

## 2022-01-17 DIAGNOSIS — M9904 Segmental and somatic dysfunction of sacral region: Secondary | ICD-10-CM | POA: Diagnosis not present

## 2022-01-17 DIAGNOSIS — G8929 Other chronic pain: Secondary | ICD-10-CM

## 2022-01-17 DIAGNOSIS — M9901 Segmental and somatic dysfunction of cervical region: Secondary | ICD-10-CM | POA: Diagnosis not present

## 2022-01-17 DIAGNOSIS — M545 Low back pain, unspecified: Secondary | ICD-10-CM

## 2022-01-17 DIAGNOSIS — M9903 Segmental and somatic dysfunction of lumbar region: Secondary | ICD-10-CM | POA: Diagnosis not present

## 2022-01-17 NOTE — Patient Instructions (Signed)
Best of luck with ENT ?See me in 7 weeks ?

## 2022-01-17 NOTE — Assessment & Plan Note (Signed)
Patient is doing relatively well at this point.  Still responding extremely well though to osteopathic manipulation.  Discussed icing regimen and home exercises.  Patient has not had to use any significant medications at this time.  Follow-up with me again in 8 weeks ?

## 2022-03-08 NOTE — Progress Notes (Signed)
?Charlann Boxer D.O. ?[facility] ?[place] ?Phone: [phone] ?Subjective:   ?I, Jacqualin Combes, am serving as a scribe for Dr. Hulan Saas. ? ?This visit occurred during the SARS-CoV-2 public health emergency.  Safety protocols were in place, including screening questions prior to the visit, additional usage of staff PPE, and extensive cleaning of exam room while observing appropriate contact time as indicated for disinfecting solutions.  ? ? ?I'm seeing this patient by the request  of:  Rankins, [name], MD ? ?CC: back and neck  ? ?[id number]  ?Raymond Branch. is a 36 y.o. male coming in with complaint of back and neck pain. OMT on 01/16/2022. Patient states he is doing well.  ? ?Medications patient has been prescribed: Vit D ? ?Taking: ? ? ?  ? ? ? ? ?Reviewed prior external information including notes and imaging from previsou exam, outside providers and external EMR if available.  ? ?As well as notes that were available from care everywhere and other healthcare systems. ? ?Past medical history, social, surgical and family history all reviewed in electronic medical record.  No pertanent information unless stated regarding to the chief complaint.  ? ?Past Medical History:  ?Diagnosis Date  ? Crohn's colitis ([place])   ? Graves disease   ?  ?No Known Allergies ? ? ?Review of Systems: ? No headache, visual changes, nausea, vomiting, diarrhea, constipation, dizziness, abdominal pain, skin rash, fevers, chills, night sweats, weight loss, swollen lymph nodes, body aches, joint swelling, chest pain, shortness of breath, mood changes. POSITIVE muscle aches ? ?Objective  ?Blood pressure 110/80, pulse 92, height 5' 11"  (1.803 m), weight 200 lb (90.7 kg), SpO2 97 %. ?  ?General: No apparent distress alert and oriented x3 mood and affect normal, dressed appropriately.  ?HEENT: Pupils equal, extraocular movements intact  ?Respiratory: Patient's speak in full sentences and does  not appear short of breath  ?Cardiovascular: No lower extremity edema, non tender, no erythema  ?Neuro: Cranial nerves II through XII are intact, neurovascularly intact in all extremities with 2+ DTRs and 2+ pulses.  ?Gait normal with good balance and coordination.  ?MSK:  Non tender with full range of motion and good stability and symmetric strength and tone of shoulders, elbows, wrist, hip, knee and ankles bilaterally.  ?Back - Normal skin, Spine with normal alignment and no deformity.  No tenderness to vertebral process palpation.  Paraspinous muscles are not tender and without spasm.   Range of motion is full at neck and lumbar sacral regions ? ?Osteopathic findings ? ?C2 flexed rotated and side bent right ?T7 extended rotated and side bent left ?L2 flexed rotated and side bent right ?Sacrum right on right ? ? ? ? ?  ?Assessment and Plan: ? ?Low back pain ?Responded extremely well to osteopathic manipulation.  No significant changes in management at the moment.  Responding well to core strengthening and home exercises follow-up again in 6 to 12 weeks.  ? ?Nonallopathic problems ? ?Decision today to treat with OMT was based on Physical Exam ? ?After verbal consent patient was treated with HVLA, ME, FPR techniques in cervical,  thoracic, lumbar, and sacral  areas ? ?Patient tolerated the procedure well with improvement in symptoms ? ?Patient given exercises, stretches and lifestyle modifications ? ?See medications in patient instructions if given ? ?Patient will follow up in 4-8 weeks ? ?  ? ? ?The above documentation has been reviewed and is accurate and complete [name],  DO ? ? ? ?  ? ? Note: This dictation was prepared with Dragon dictation along with smaller phrase technology. Any transcriptional errors that result from this process are unintentional.    ?  ?  ? ?

## 2022-03-12 ENCOUNTER — Ambulatory Visit (INDEPENDENT_AMBULATORY_CARE_PROVIDER_SITE_OTHER): Payer: Managed Care, Other (non HMO) | Admitting: Family Medicine

## 2022-03-12 VITALS — BP 110/80 | HR 92 | Ht 71.0 in | Wt 200.0 lb

## 2022-03-12 DIAGNOSIS — M9902 Segmental and somatic dysfunction of thoracic region: Secondary | ICD-10-CM | POA: Diagnosis not present

## 2022-03-12 DIAGNOSIS — M545 Low back pain, unspecified: Secondary | ICD-10-CM | POA: Diagnosis not present

## 2022-03-12 DIAGNOSIS — M9901 Segmental and somatic dysfunction of cervical region: Secondary | ICD-10-CM

## 2022-03-12 DIAGNOSIS — M9904 Segmental and somatic dysfunction of sacral region: Secondary | ICD-10-CM | POA: Diagnosis not present

## 2022-03-12 DIAGNOSIS — M9903 Segmental and somatic dysfunction of lumbar region: Secondary | ICD-10-CM | POA: Diagnosis not present

## 2022-03-12 DIAGNOSIS — G8929 Other chronic pain: Secondary | ICD-10-CM

## 2022-03-12 NOTE — Patient Instructions (Signed)
Good to see you ?See me again in 7 weeks ?

## 2022-03-12 NOTE — Assessment & Plan Note (Signed)
Responded extremely well to osteopathic manipulation.  No significant changes in management at the moment.  Responding well to core strengthening and home exercises follow-up again in 6 to 12 weeks. ?

## 2022-04-26 NOTE — Progress Notes (Unsigned)
  [place] Phone: [phone] Subjective:    I'm seeing this patient by the request  of:  Rankins, [name], MD  CC:   [id number]  Raymond Branch. is a 36 y.o. male coming in with complaint of back and neck pain. OMT 03/12/2022. Patient states   Medications patient has been prescribed: None  Taking:         Reviewed prior external information including notes and imaging from previsou exam, outside providers and external EMR if available.   As well as notes that were available from care everywhere and other healthcare systems.  Past medical history, social, surgical and family history all reviewed in electronic medical record.  No pertanent information unless stated regarding to the chief complaint.   Past Medical History:  Diagnosis Date   Crohn's colitis ([place])    Graves disease     No Known Allergies   Review of Systems:  No headache, visual changes, nausea, vomiting, diarrhea, constipation, dizziness, abdominal pain, skin rash, fevers, chills, night sweats, weight loss, swollen lymph nodes, body aches, joint swelling, chest pain, shortness of breath, mood changes. POSITIVE muscle aches  Objective  There were no vitals taken for this visit.   General: No apparent distress alert and oriented x3 mood and affect normal, dressed appropriately.  HEENT: Pupils equal, extraocular movements intact  Respiratory: Patient's speak in full sentences and does not appear short of breath  Cardiovascular: No lower extremity edema, non tender, no erythema  Gait MSK:  Back   Osteopathic findings  C2 flexed rotated and side bent right C6 flexed rotated and side bent left T3 extended rotated and side bent right inhaled rib T9 extended rotated and side bent left L2 flexed rotated and side bent right Sacrum right on right       Assessment and Plan:  No problem-specific Assessment & Plan notes  found for this encounter.    Nonallopathic problems  Decision today to treat with OMT was based on Physical Exam  After verbal consent patient was treated with HVLA, ME, FPR techniques in cervical, rib, thoracic, lumbar, and sacral  areas  Patient tolerated the procedure well with improvement in symptoms  Patient given exercises, stretches and lifestyle modifications  See medications in patient instructions if given  Patient will follow up in 4-8 weeks             Note: This dictation was prepared with Dragon dictation along with smaller phrase technology. Any transcriptional errors that result from this process are unintentional.

## 2022-05-01 ENCOUNTER — Ambulatory Visit (INDEPENDENT_AMBULATORY_CARE_PROVIDER_SITE_OTHER): Payer: Managed Care, Other (non HMO) | Admitting: Family Medicine

## 2022-05-01 ENCOUNTER — Encounter: Payer: Self-pay | Admitting: Family Medicine

## 2022-05-01 VITALS — BP 124/82 | HR 94 | Ht 71.0 in | Wt 199.0 lb

## 2022-05-01 DIAGNOSIS — M9908 Segmental and somatic dysfunction of rib cage: Secondary | ICD-10-CM | POA: Diagnosis not present

## 2022-05-01 DIAGNOSIS — M9902 Segmental and somatic dysfunction of thoracic region: Secondary | ICD-10-CM | POA: Diagnosis not present

## 2022-05-01 DIAGNOSIS — M9903 Segmental and somatic dysfunction of lumbar region: Secondary | ICD-10-CM | POA: Diagnosis not present

## 2022-05-01 DIAGNOSIS — G8929 Other chronic pain: Secondary | ICD-10-CM

## 2022-05-01 DIAGNOSIS — M9901 Segmental and somatic dysfunction of cervical region: Secondary | ICD-10-CM

## 2022-05-01 DIAGNOSIS — M9904 Segmental and somatic dysfunction of sacral region: Secondary | ICD-10-CM

## 2022-05-01 DIAGNOSIS — M545 Low back pain, unspecified: Secondary | ICD-10-CM

## 2022-05-01 NOTE — Assessment & Plan Note (Signed)
Continues to have tightness noted.  Seems to be multifactorial.  Has noticed more tightness in the mornings.  We will see how patient responds.  Discussed posture and ergonomics, which activities to do which ones to avoid, patient will be traveling for the summer and does have sinus surgeries coming back in the near future as well.  Follow-up with me again in 6 to 8 weeks otherwise.

## 2022-05-03 ENCOUNTER — Other Ambulatory Visit: Payer: Self-pay | Admitting: Otolaryngology

## 2022-06-22 ENCOUNTER — Ambulatory Visit: Payer: Managed Care, Other (non HMO) | Admitting: Sports Medicine

## 2022-06-22 ENCOUNTER — Ambulatory Visit: Payer: Self-pay

## 2022-06-22 VITALS — BP 118/84 | HR 107 | Ht 71.0 in | Wt 199.0 lb

## 2022-06-22 DIAGNOSIS — M7021 Olecranon bursitis, right elbow: Secondary | ICD-10-CM

## 2022-06-22 DIAGNOSIS — M25521 Pain in right elbow: Secondary | ICD-10-CM

## 2022-06-22 MED ORDER — MELOXICAM 15 MG PO TABS: 15.0000 mg | ORAL_TABLET | Freq: Every day | ORAL | 0 refills | Status: DC

## 2022-06-22 NOTE — Patient Instructions (Addendum)
Good to see you - Start meloxicam 15 mg daily x2 weeks.  If still having pain after 2 weeks, complete 3rd-week of meloxicam. May use remaining meloxicam as needed once daily for pain control.  Do not to use additional NSAIDs while taking meloxicam.  May use Tylenol [phone] mg 2 to 3 times a day for breakthrough pain. Recommend rest and no upper body exercise for 2 weeks  Will send fluid off for analysis  2 week follow up

## 2022-06-22 NOTE — Progress Notes (Signed)
Raymond Branch [name] Phone: [phone]   Assessment and Plan:     1. Right elbow pain 2. Olecranon bursitis of right elbow -Acute, uncertain prognosis, initial sports medicine visit - 1 day of right elbow swelling, erythema, pain with radicular symptoms without MOI or history of similar presentation - Consistent with olecranon bursitis.  Aspiration and drainage performed and tolerated well per note below.  Corticosteroid was not introduced as aspirate had cloudy tinge. - Start meloxicam 15 mg daily x2 weeks.  If still having pain after 2 weeks, complete 3rd-week of meloxicam. May use remaining meloxicam as needed once daily for pain control.  Do not to use additional NSAIDs while taking meloxicam.  May use Tylenol [phone] mg 2 to 3 times a day for breakthrough pain. - Recommend avoiding physical activity with right upper extremity for the next 2 weeks  Procedure: Ultrasound Guided Olecranon Bursa Drainage/Aspiration Side: Right Diagnosis: Olecranon bursitis [place] Indication:  - accuracy is paramount for diagnosis - to ensure therapeutic efficacy or procedural success - to reduce procedural risk  After explaining the procedure, viable alternatives, risks, and answering any questions, consent was given verbally. The site was cleaned with chlorhexidine prep. An ultrasound transducer was placed on the olecranon bursa.   using a large bore needle 81m of cloudy, white/yellow fluid was removed.  The fluid was sent for analysis.  The needle was removed, pressure dressing placed and post injection instructions were given including a discussion of likely return of pain today after the anesthetic wears off (with the possibility of worsened pain).  Pt was advised to call or return to clinic if these symptoms worsen or fail to improve as anticipated.     Pertinent previous records reviewed include none   Follow Up: 2  weeks for reevaluation   Subjective:   I, Raymond Branch, am serving as a sEducation administratorfor Doctor BGlennon Mac Chief Complaint: right elbow swelling  HPI:   06/22/22 Patient is a 36year old male complaining of right elbow swelling. Patient states that no MOI worked out Wednesday with light weight no pain , lst night it felt like he bruised it, just painful and bruised , very TTP , took tylenol middle of the night , decreased ROM, numbness and tingling down to the fingers, holding bags or anything isnt an issue , has been nauseas all morning   Relevant Historical Information: None pertinent  Additional pertinent review of systems negative.  No current outpatient medications on file.   Objective:     Vitals:   06/22/22 1031  BP: 118/84  Pulse: (!) 107  SpO2: 98%  Weight: 199 lb (90.3 kg)  Height: _0  (1.803 m)      Body mass index is 27.75 kg/m.    Physical Exam:    General: Appears well, no acute distress, nontoxic and pleasant Neck: FROM, no pain Neuro: sensation is intact distally with no deficits, strenghth is 5/5 in elbow flexors/extenders/supinator/pronators and wrist flexors/extensors Psych: no evidence of anxiety or depression  ELBOW: no deformity,   or muscle wasting Moderate swelling over olecranon with erythema and warmth.  No open lesion. Normal Carrying angle ROM:0-140, supination and pronation 90 TTP olecranon NTTP over triceps, ticeps tendon,  , lat epicondyle, medial epicondyle, antecubital fossa, biceps tendon, supinator, pronator Negative tinnels over cubital tunnel No pain with resisted wrist and middle digit extension No pain with resisted wrist flexion No pain with  resisted supination No pain with resisted pronation Negative valgus stress Negative varus stress Negative milking maneuver    Electronically signed by:  Raymond Branch D.[facility] 10:58 AM 06/22/22

## 2022-06-23 ENCOUNTER — Other Ambulatory Visit: Payer: Self-pay

## 2022-06-23 ENCOUNTER — Emergency Department (HOSPITAL_BASED_OUTPATIENT_CLINIC_OR_DEPARTMENT_OTHER): Payer: Managed Care, Other (non HMO)

## 2022-06-23 ENCOUNTER — Emergency Department (HOSPITAL_BASED_OUTPATIENT_CLINIC_OR_DEPARTMENT_OTHER)
Admission: EM | Admit: 2022-06-23 | Discharge: 2022-06-23 | Disposition: A | Discharge status: Home / Self Care | Payer: Managed Care, Other (non HMO) | Attending: Emergency Medicine | Admitting: Emergency Medicine

## 2022-06-23 DIAGNOSIS — R6883 Chills (without fever): Secondary | ICD-10-CM | POA: Insufficient documentation

## 2022-06-23 DIAGNOSIS — M7021 Olecranon bursitis, right elbow: Secondary | ICD-10-CM | POA: Insufficient documentation

## 2022-06-23 DIAGNOSIS — M25521 Pain in right elbow: Secondary | ICD-10-CM | POA: Diagnosis present

## 2022-06-23 DIAGNOSIS — Y9389 Activity, other specified: Secondary | ICD-10-CM | POA: Diagnosis not present

## 2022-06-23 LAB — CBC WITH DIFFERENTIAL/PLATELET
Abs Immature Granulocytes: 0.04 10*3/uL (ref 0.00–0.07)
Basophils Absolute: 0 10*3/uL (ref 0.0–0.1)
Basophils Relative: 0 %
Eosinophils Absolute: 0 10*3/uL (ref 0.0–0.5)
Eosinophils Relative: 0 %
HCT: 38.8 % — ABNORMAL LOW (ref 39.0–52.0)
Hemoglobin: 14.4 g/dL (ref 13.0–17.0)
Immature Granulocytes: 0 %
Lymphocytes Relative: 13 %
Lymphs Abs: 2 10*3/uL (ref 0.7–4.0)
MCH: 31.6 pg (ref 26.0–34.0)
MCHC: 37.1 g/dL — ABNORMAL HIGH (ref 30.0–36.0)
MCV: 85.1 fL (ref 80.0–100.0)
Monocytes Absolute: 1.4 10*3/uL — ABNORMAL HIGH (ref 0.1–1.0)
Monocytes Relative: 9 %
Neutro Abs: 11.2 10*3/uL — ABNORMAL HIGH (ref 1.7–7.7)
Neutrophils Relative %: 78 %
Platelets: 210 10*3/uL (ref 150–400)
RBC: 4.56 MIL/uL (ref 4.22–5.81)
RDW: 12.5 % (ref 11.5–15.5)
WBC: 14.6 10*3/uL — ABNORMAL HIGH (ref 4.0–10.5)
nRBC: 0 % (ref 0.0–0.2)

## 2022-06-23 LAB — PROTIME-INR
INR: 1.1 (ref 0.8–1.2)
Prothrombin Time: 13.8 seconds (ref 11.4–15.2)

## 2022-06-23 LAB — COMPREHENSIVE METABOLIC PANEL
ALT: 32 U/L (ref 0–44)
AST: 56 U/L — ABNORMAL HIGH (ref 15–41)
Albumin: 4.1 g/dL (ref 3.5–5.0)
Alkaline Phosphatase: 62 U/L (ref 38–126)
Anion gap: 9 (ref 5–15)
BUN: 11 mg/dL (ref 6–20)
CO2: 23 mmol/L (ref 22–32)
Calcium: 9.2 mg/dL (ref 8.9–10.3)
Chloride: 102 mmol/L (ref 98–111)
Creatinine, Ser: 0.69 mg/dL (ref 0.61–1.24)
GFR, Estimated: >60 mL/min (ref >60)
Glucose, Bld: 143 mg/dL — ABNORMAL HIGH (ref 70–99)
Potassium: 3.4 mmol/L — ABNORMAL LOW (ref 3.5–5.1)
Sodium: 134 mmol/L — ABNORMAL LOW (ref 135–145)
Total Bilirubin: 1.1 mg/dL (ref 0.3–1.2)
Total Protein: 7.5 g/dL (ref 6.5–8.1)

## 2022-06-23 LAB — LACTIC ACID, PLASMA: Lactic Acid, Venous: 1.3 mmol/L (ref 0.5–1.9)

## 2022-06-23 MED ORDER — SODIUM CHLORIDE 0.9 % IV SOLN
1.0000 g | Freq: Once | INTRAVENOUS | Status: AC
  Administered 2022-06-23: 1 g via INTRAVENOUS
  Filled 2022-06-23: qty 10

## 2022-06-23 MED ORDER — AMOXICILLIN-POT CLAVULANATE 875-125 MG PO TABS: 1.0000 | ORAL_TABLET | Freq: Two times a day (BID) | ORAL | 0 refills | Status: DC

## 2022-06-23 MED ORDER — HYDROCODONE-ACETAMINOPHEN 5-325 MG PO TABS
1.0000 | ORAL_TABLET | Freq: Once | ORAL | Status: AC
  Administered 2022-06-23: 1 via ORAL
  Filled 2022-06-23: qty 1

## 2022-06-23 NOTE — ED Notes (Signed)
Area of R elbow cellulitis swelling demarcated

## 2022-06-23 NOTE — Discharge Instructions (Addendum)
Your work-up today showed elevation in your white blood cell count which could indicate infection.  You received a dose of antibiotic in the emergency room.  I have sent additional antibiotic into the pharmacy for you.  Continue taking your meloxicam.  If you notice any worsening redness beyond the border was marked tonight for persistent fever after taking antibiotics for at least 24 hours please return for evaluation.  Otherwise follow-up with your sports medicine doctor.

## 2022-06-23 NOTE — ED Triage Notes (Signed)
Patient arrives with complaints of worsening right elbow swelling x2 days. Patient had the site drained at his pcp yesterday, with minimal output. Patient states that he does not know that cause if it. No bites or cuts.   Rates pain a 7/10.

## 2022-06-23 NOTE — ED Notes (Signed)
Patient verbalizes understanding of discharge instructions. Opportunity for questioning and answers were provided. Armband removed by staff, pt discharged from ED. Ambulated out to lobby  

## 2022-06-23 NOTE — ED Provider Notes (Signed)
[facility] EMERGENCY DEPT Provider Note   CSN: [id number] Arrival date & time: 06/23/22  1558     History  Chief Complaint  Patient presents with   Cellulitis    Right Elbow   Chills    Raymond Valdez. is a 36 y.o. male.  36 year old male presents today for evaluation of worsening right elbow pain, associated with worsening swelling and erythema.  This started Thursday afternoon.  Patient was evaluated at PCP office yesterday and had bursal fluid removed.  He was started on meloxicam and treated for olecranon bursitis.  Patient states yesterday he did not have any erythema.  Today this has significantly worsened.  Pain has worsened.  He endorses chills.  The history is provided by the patient and medical records. No language interpreter was used.       Home Medications Prior to Admission medications   Medication Sig Start Date End Date Taking? Authorizing Provider  meloxicam (MOBIC) 15 MG tablet Take 1 tablet (15 mg total) by mouth daily. 06/22/22   Glennon Mac, DO      Allergies    Patient has no known allergies.    Review of Systems   Review of Systems  Constitutional:  Positive for chills. Negative for fever.  Musculoskeletal:  Positive for arthralgias and joint swelling.  All other systems reviewed and are negative.   Physical Exam Updated Vital Signs BP 124/85 (BP Location: Left Arm)   Pulse (!) 102   Temp 98.8 F (37.1 C)   Resp 18   Ht 5' 11"  (1.803 m)   Wt 90.3 kg   SpO2 100%   BMI 27.75 kg/m  Physical Exam Vitals and nursing note reviewed.  Constitutional:      General: He is not in acute distress.    Appearance: Normal appearance. He is not ill-appearing.  HENT:     Head: Normocephalic and atraumatic.     Nose: Nose normal.  Eyes:     Conjunctiva/sclera: Conjunctivae normal.  Pulmonary:     Effort: Pulmonary effort is normal. No respiratory distress.  Musculoskeletal:        General: No deformity.     Comments:  Swelling and erythema noted to right elbow.  Erythema significantly extends beyond the elbow joint.  Tenderness to palpation present of the right elbow joint.  Does have good range of motion of the right elbow joint.  2+ radial pulse present.  Full range of motion of all digits of the right hand.  Without tenderness to palpation of the right shoulder.  Skin:    Findings: No rash.  Neurological:     Mental Status: He is alert.     ED Results / Procedures / Treatments   Labs (all labs ordered are listed, but only abnormal results are displayed) Labs Reviewed  COMPREHENSIVE METABOLIC PANEL - Abnormal; Notable for the following components:      Result Value   Sodium 134 (*)    Potassium 3.4 (*)    Glucose, Bld 143 (*)    AST 56 (*)    All other components within normal limits  CBC WITH DIFFERENTIAL/PLATELET - Abnormal; Notable for the following components:   WBC 14.6 (*)    HCT 38.8 (*)    MCHC 37.1 (*)    Neutro Abs 11.2 (*)    Monocytes Absolute 1.4 (*)    All other components within normal limits  CULTURE, BLOOD (ROUTINE X 2)  CULTURE, BLOOD (ROUTINE X 2)  LACTIC ACID, PLASMA  PROTIME-INR  LACTIC ACID, PLASMA    EKG None  Radiology No results found.  Procedures Procedures    Medications Ordered in ED Medications - No data to display  ED Course/ Medical Decision Making/ A&P                           Medical Decision Making Amount and/or Complexity of Data Reviewed Labs: ordered. Radiology: ordered.  Risk Prescription drug management.   Medical Decision Making / ED Course   This patient presents to the ED for concern of right elbow pain and swelling, this involves an extensive number of treatment options, and is a complaint that carries with it a high risk of complications and morbidity.  The differential diagnosis includes olecranon bursitis, septic joint, cellulitis  MDM: 36 year old male presents for evaluation of worsening right elbow pain with  associated swelling and erythema.  Denies fever.  Reported some chills earlier today.  Initially noted to be tachycardic however on my evaluation rates of around 90.  Good range of motion.  Good strength.  Erythema noted distal and proximal to his elbow joint.  Tenderness to palpation present over the olecranon bursa.  He had course of fluid removed yesterday at the sports medicine clinic.  It was cloudy in appearance.  Some white blood cells noted in culture.  Leukocytosis noted on work-up today.  Blood cultures collected.  Dose of Rocephin provided.  Will discharge with follow-up in discussed follow-up with his sports medicine provider.  Strict return precautions given for any worsening symptoms that may warrant IV antibiotic patient voices understanding and is in agreement with the plan.    Additional history obtained: -Additional history obtained from sports medicine visit yesterday confirming bursa fluid was removed and was cloudy in appearance -External records from outside source obtained and reviewed including: Chart review including previous notes, labs, imaging, consultation notes   Lab Tests: -I ordered, reviewed, and interpreted labs.   The pertinent results include:   Labs Reviewed  COMPREHENSIVE METABOLIC PANEL - Abnormal; Notable for the following components:      Result Value   Sodium 134 (*)    Potassium 3.4 (*)    Glucose, Bld 143 (*)    AST 56 (*)    All other components within normal limits  CBC WITH DIFFERENTIAL/PLATELET - Abnormal; Notable for the following components:   WBC 14.6 (*)    HCT 38.8 (*)    MCHC 37.1 (*)    Neutro Abs 11.2 (*)    Monocytes Absolute 1.4 (*)    All other components within normal limits  CULTURE, BLOOD (ROUTINE X 2)  CULTURE, BLOOD (ROUTINE X 2)  LACTIC ACID, PLASMA  PROTIME-INR  LACTIC ACID, PLASMA      EKG  EKG Interpretation  Date/Time:    Ventricular Rate:    PR Interval:    QRS Duration:   QT Interval:    QTC Calculation:    R Axis:     Text Interpretation:           Imaging Studies ordered: I ordered imaging studies including right elbow x-ray I independently visualized and interpreted imaging. I agree with the radiologist interpretation   Medicines ordered and prescription drug management: Meds ordered this encounter  Medications   HYDROcodone-acetaminophen (NORCO/VICODIN) 5-325 MG per tablet 1 tablet   cefTRIAXone (ROCEPHIN) 1 g in sodium chloride 0.9 % 100 mL IVPB    Order Specific Question:   Antibiotic Indication:  Answer:   Cellulitis    -I have reviewed the patients home medicines and have made adjustments as needed  Reevaluation: After the interventions noted above, I reevaluated the patient and found that they have :improved  Co morbidities that complicate the patient evaluation  Past Medical History:  Diagnosis Date   Crohn's colitis ([place])    Graves disease       Dispostion: Patient is appropriate for discharge.  Discharged in stable condition.  Return precautions discussed.  Final Clinical Impression(s) / ED Diagnoses Final diagnoses:  Olecranon bursitis of right elbow    Rx / DC Orders ED Discharge Orders          Ordered    amoxicillin-clavulanate (AUGMENTIN) 875-125 MG tablet  Every 12 hours        06/23/22 2120              Evlyn Courier, PA-C 06/23/22 2121    Dorie Rank, MD 06/24/22 2043

## 2022-06-28 LAB — CULTURE, BLOOD (ROUTINE X 2): Culture: NO GROWTH

## 2022-06-28 LAB — ANAEROBIC AND AEROBIC CULTURE
MICRO NUMBER:: 13799868
MICRO NUMBER:: 13799869
SPECIMEN QUALITY:: ADEQUATE
SPECIMEN QUALITY:: ADEQUATE

## 2022-07-02 NOTE — Progress Notes (Unsigned)
Raymond Branch [place] [address] [place] Phone: [phone] Subjective:   [name], am serving as a scribe for Dr. Hulan Saas.  I'm seeing this patient by the request  of:  Rankins, [name], MD  CC: back and neck pain follow up   [id number]  Raymond Branch. is a 36 y.o. male coming in with complaint of back and neck pain. OMT on 05/01/2022. Saw Dr. Glennon Mac in Aug for right elbow pain. Patient states elbow pain still persist. About 95% better.  Medications patient has been prescribed: None          Reviewed prior external information including notes and imaging from previsou exam, outside providers and external EMR if available.   As well as notes that were available from care everywhere and other healthcare systems.  Past medical history, social, surgical and family history all reviewed in electronic medical record.  No pertanent information unless stated regarding to the chief complaint.   Past Medical History:  Diagnosis Date   Crohn's colitis ([place])    Graves disease        Review of Systems:  No headache, visual changes, nausea, vomiting, diarrhea, constipation, dizziness, abdominal pain, skin rash, fevers, chills, night sweats, weight loss, swollen lymph nodes, body aches, joint swelling, chest pain, shortness of breath, mood changes. POSITIVE muscle aches  Objective  Blood pressure 120/78, pulse 78, height 5' 11"  (1.803 m), weight 194 lb (88 kg), SpO2 97 %.   General: No apparent distress alert and oriented x3 mood and affect normal, dressed appropriately.  HEENT: Pupils equal, extraocular movements intact  Respiratory: Patient's speak in full sentences and does not appear short of breath  Cardiovascular: No lower extremity edema, non tender, no erythema  Gait overall normal MSK:  Back does have some loss of lordosis.  Some tightness noted in the sacroiliac joint right greater than left.  Tightness in the  neck with sidebending bilaterally.  Tightness with FABER test. Right elbow exam does have swelling noted today.  Erythema noted today.  Tender to palpation noted as well.  Limited muscular skeletal ultrasound was performed and interpreted by Hulan Saas, M  Ultrasound still shows some hypoechoic changes noted.  Patient also has what appears to be some increasing in neovascularization in Doppler flow.  Some of it seems to be in the tendon of the tricep in the area. Impression: Questionable cellulitis  Osteopathic findings  C2 flexed rotated and side bent right C7 flexed rotated and side bent left T3 extended rotated and side bent right inhaled rib T8 extended rotated and side bent left L2 flexed rotated and side bent right Sacrum right on right    Assessment and Plan:  Cellulitis of right elbow On ultrasound patient still has some hypoechoic changes and some increasing in Doppler flow that is consistent still with likely some smoldering cellulitis.  Does not appear to go to the bone at the moment.  I am concerned the patient also had some tendon irritation in the area and will do another week of antibiotics.  With patient having difficulty with his Crohn's disease though we will continue the amoxicillin with the sensitivity.  Follow-up again in 6 weeks otherwise.  Patient knows if worsening redness or pain to seek medical attention immediately.    Nonallopathic problems  Decision today to treat with OMT was based on Physical Exam  After verbal consent patient was treated with HVLA, ME, FPR techniques in cervical, rib, thoracic,  lumbar, and sacral  areas  Patient tolerated the procedure well with improvement in symptoms  Patient given exercises, stretches and lifestyle modifications  See medications in patient instructions if given  Patient will follow up in 4-8 weeks    The above documentation has been reviewed and is accurate and complete Raymond Pulley, DO           Note: This dictation was prepared with Dragon dictation along with smaller phrase technology. Any transcriptional errors that result from this process are unintentional.

## 2022-07-04 ENCOUNTER — Ambulatory Visit: Payer: Managed Care, Other (non HMO) | Admitting: Family Medicine

## 2022-07-04 ENCOUNTER — Ambulatory Visit: Payer: Self-pay

## 2022-07-04 VITALS — BP 120/78 | HR 78 | Ht 71.0 in | Wt 194.0 lb

## 2022-07-04 DIAGNOSIS — M9904 Segmental and somatic dysfunction of sacral region: Secondary | ICD-10-CM

## 2022-07-04 DIAGNOSIS — M25521 Pain in right elbow: Secondary | ICD-10-CM

## 2022-07-04 DIAGNOSIS — M9902 Segmental and somatic dysfunction of thoracic region: Secondary | ICD-10-CM | POA: Diagnosis not present

## 2022-07-04 DIAGNOSIS — M9908 Segmental and somatic dysfunction of rib cage: Secondary | ICD-10-CM

## 2022-07-04 DIAGNOSIS — M9903 Segmental and somatic dysfunction of lumbar region: Secondary | ICD-10-CM

## 2022-07-04 DIAGNOSIS — L03113 Cellulitis of right upper limb: Secondary | ICD-10-CM | POA: Insufficient documentation

## 2022-07-04 DIAGNOSIS — M9901 Segmental and somatic dysfunction of cervical region: Secondary | ICD-10-CM | POA: Diagnosis not present

## 2022-07-04 MED ORDER — AMOXICILLIN 875 MG PO TABS: 875.0000 mg | ORAL_TABLET | Freq: Two times a day (BID) | ORAL | 0 refills | Status: DC

## 2022-07-04 MED ORDER — AMOXICILLIN 875 MG PO TABS
875.0000 mg | ORAL_TABLET | Freq: Two times a day (BID) | ORAL | 0 refills | Status: DC
Start: 2022-07-04 — End: 2022-12-11

## 2022-07-04 NOTE — Assessment & Plan Note (Signed)
On ultrasound patient still has some hypoechoic changes and some increasing in Doppler flow that is consistent still with likely some smoldering cellulitis.  Does not appear to go to the bone at the moment.  I am concerned the patient also had some tendon irritation in the area and will do another week of antibiotics.  With patient having difficulty with his Crohn's disease though we will continue the amoxicillin with the sensitivity.  Follow-up again in 6 weeks otherwise.  Patient knows if worsening redness or pain to seek medical attention immediately.

## 2022-07-04 NOTE — Patient Instructions (Signed)
Prescription sent in  

## 2022-07-19 ENCOUNTER — Other Ambulatory Visit: Payer: Self-pay | Admitting: Sports Medicine

## 2022-08-24 NOTE — Progress Notes (Signed)
  Raymond Branch Raymond Branch [address] Raymond Branch Phone: [phone] Subjective:   Raymond Branch, am serving as a scribe for Dr. Hulan Saas.  I'm seeing this patient by the request  of:  Rankins, [name], MD  CC: Neck and back pain follow-up  Raymond Branch  Raymond Branch. is a 36 y.o. male coming in with complaint of back and neck pain. OMT on 07/04/2022. Also f/u on elbow pain. Patient states doing well. Here for manipulation. No other issues.  Medications patient has been prescribed: amoxicillin  Taking:         Reviewed prior external information including notes and imaging from previsou exam, outside providers and external EMR if available.   As well as notes that were available from care everywhere and other healthcare systems.  Past medical history, social, surgical and family history all reviewed in electronic medical record.  No pertanent information unless stated regarding to the chief complaint.   Past Medical History:  Diagnosis Date   Crohn's colitis ([place])    Graves disease     No Known Allergies   Review of Systems:  No headache, visual changes, nausea, vomiting, diarrhea, constipation, dizziness, abdominal pain, skin rash, fevers, chills, night sweats, weight loss, swollen lymph nodes, body aches, joint swelling, chest pain, shortness of breath, mood changes. POSITIVE muscle aches  Objective  Blood pressure (!) 138/92, pulse 91, height 5' 11"  (1.803 m), weight 198 lb (89.8 kg), SpO2 97 %.   General: No apparent distress alert and oriented x3 mood and affect normal, dressed appropriately.  HEENT: Pupils equal, extraocular movements intact  Respiratory: Patient's speak in full sentences and does not appear short of breath  Cardiovascular: No lower extremity edema, non tender, no erythema  Gait MSK:  Back back exam does actually have relatively good range of motion noted.  Seems to be more of the upper back in the  neck today.  Mild loss of lordosis of the neck.  Tenderness to palpation noted.  Osteopathic findings  C2 flexed rotated and side bent right C6 flexed rotated and side bent left T3 extended rotated and side bent right inhaled rib T9 extended rotated and side bent left       Assessment and Plan:  Low back pain Patient is resident typically stable for the lower back today.  Did Raymond Branch some osteopathic manipulation but very mild overall.  Has meloxicam for breakthrough pain but tries to avoid taking it secondary to the history of Crohn's disease.  Follow-up with me again in 8 to 12 weeks.    Nonallopathic problems  Decision today to treat with OMT was based on Physical Exam  After verbal consent patient was treated with HVLA, ME, FPR techniques in cervical, rib, thoracic,  areas  Patient tolerated the procedure well with improvement in symptoms  Patient given exercises, stretches and lifestyle modifications  See medications in patient instructions if given  Patient will follow up in 8-12 weeks     The above documentation has been reviewed and is accurate and complete Raymond Branch, Raymond Branch         Note: This dictation was prepared with Dragon dictation along with smaller phrase technology. Any transcriptional errors that result from this process are unintentional.

## 2022-08-28 ENCOUNTER — Encounter: Payer: Self-pay | Admitting: Family Medicine

## 2022-08-28 ENCOUNTER — Ambulatory Visit (INDEPENDENT_AMBULATORY_CARE_PROVIDER_SITE_OTHER): Payer: Managed Care, Other (non HMO) | Admitting: Family Medicine

## 2022-08-28 DIAGNOSIS — M9902 Segmental and somatic dysfunction of thoracic region: Secondary | ICD-10-CM | POA: Diagnosis not present

## 2022-08-28 DIAGNOSIS — M9901 Segmental and somatic dysfunction of cervical region: Secondary | ICD-10-CM

## 2022-08-28 DIAGNOSIS — M545 Low back pain, unspecified: Secondary | ICD-10-CM | POA: Diagnosis not present

## 2022-08-28 DIAGNOSIS — M9908 Segmental and somatic dysfunction of rib cage: Secondary | ICD-10-CM

## 2022-08-28 DIAGNOSIS — G8929 Other chronic pain: Secondary | ICD-10-CM

## 2022-08-28 NOTE — Assessment & Plan Note (Signed)
Patient is resident typically stable for the lower back today.  Did do some osteopathic manipulation but very mild overall.  Has meloxicam for breakthrough pain but tries to avoid taking it secondary to the history of Crohn's disease.  Follow-up with me again in 8 to 12 weeks.

## 2022-10-18 NOTE — Progress Notes (Deleted)
   Raymond Branch Phone: [phone] Subjective:    I'm seeing this patient by the request  of:  Rankins, [name], MD  CC: back and neck pain   [id number]  Raymond Branch. is a 36 y.o. male coming in with complaint of back and neck pain. OMT 08/28/2022. Patient states   Medications patient has been prescribed: None  Taking:         Reviewed prior external information including notes and imaging from previsou exam, outside providers and external EMR if available.   As well as notes that were available from care everywhere and other healthcare systems.  Past medical history, social, surgical and family history all reviewed in electronic medical record.  No pertanent information unless stated regarding to the chief complaint.   Past Medical History:  Diagnosis Date   Crohn's colitis ([place])    Graves disease     No Known Allergies   Review of Systems:  No headache, visual changes, nausea, vomiting, diarrhea, constipation, dizziness, abdominal pain, skin rash, fevers, chills, night sweats, weight loss, swollen lymph nodes, body aches, joint swelling, chest pain, shortness of breath, mood changes. POSITIVE muscle aches  Objective  There were no vitals taken for this visit.   General: No apparent distress alert and oriented x3 mood and affect normal, dressed appropriately.  HEENT: Pupils equal, extraocular movements intact  Respiratory: Patient's speak in full sentences and does not appear short of breath  Cardiovascular: No lower extremity edema, non tender, no erythema  Gait MSK:  Back   Osteopathic findings  C2 flexed rotated and side bent right C6 flexed rotated and side bent left T3 extended rotated and side bent right inhaled rib T9 extended rotated and side bent left L2 flexed rotated and side bent right Sacrum right on right       Assessment and Plan:  No problem-specific  Assessment & Plan notes found for this encounter.    Nonallopathic problems  Decision today to treat with OMT was based on Physical Exam  After verbal consent patient was treated with HVLA, ME, FPR techniques in cervical, rib, thoracic, lumbar, and sacral  areas  Patient tolerated the procedure well with improvement in symptoms  Patient given exercises, stretches and lifestyle modifications  See medications in patient instructions if given  Patient will follow up in 4-8 weeks     The above documentation has been reviewed and is accurate and complete [name], DO         Note: This dictation was prepared with Dragon dictation along with smaller phrase technology. Any transcriptional errors that result from this process are unintentional.

## 2022-10-22 ENCOUNTER — Ambulatory Visit: Payer: Managed Care, Other (non HMO) | Admitting: Family Medicine

## 2022-12-11 ENCOUNTER — Encounter: Payer: Self-pay | Admitting: Family Medicine

## 2022-12-11 ENCOUNTER — Ambulatory Visit: Payer: Managed Care, Other (non HMO) | Admitting: Family Medicine

## 2022-12-11 ENCOUNTER — Ambulatory Visit: Payer: Self-pay

## 2022-12-11 VITALS — BP 122/88 | HR 84 | Ht 71.0 in | Wt 206.0 lb

## 2022-12-11 DIAGNOSIS — M9902 Segmental and somatic dysfunction of thoracic region: Secondary | ICD-10-CM

## 2022-12-11 DIAGNOSIS — G8929 Other chronic pain: Secondary | ICD-10-CM

## 2022-12-11 DIAGNOSIS — M9904 Segmental and somatic dysfunction of sacral region: Secondary | ICD-10-CM | POA: Diagnosis not present

## 2022-12-11 DIAGNOSIS — M9908 Segmental and somatic dysfunction of rib cage: Secondary | ICD-10-CM | POA: Diagnosis not present

## 2022-12-11 DIAGNOSIS — M1712 Unilateral primary osteoarthritis, left knee: Secondary | ICD-10-CM | POA: Diagnosis not present

## 2022-12-11 DIAGNOSIS — M9903 Segmental and somatic dysfunction of lumbar region: Secondary | ICD-10-CM

## 2022-12-11 DIAGNOSIS — M9901 Segmental and somatic dysfunction of cervical region: Secondary | ICD-10-CM | POA: Diagnosis not present

## 2022-12-11 NOTE — Progress Notes (Signed)
[facility] Phone: [phone] Subjective:   Raymond Branch, am serving as a scribe for Dr. Hulan Saas.  I'm seeing this patient by the request  of:  Rankins, [name], MD  CC: Left knee pain and back pain  [id number]  Raymond Branch. is a 37 y.o. male coming in with complaint of back and neck pain. OMT 08/28/2022. Patient states that he has been doing well. Played basketball on Friday and L knee pain and swelling increased. Pain over ITB.   Medications patient has been prescribed:   Taking:         Reviewed prior external information including notes and imaging from previsou exam, outside providers and external EMR if available.   As well as notes that were available from care everywhere and other healthcare systems.  Past medical history, social, surgical and family history all reviewed in electronic medical record.  Branch pertanent information unless stated regarding to the chief complaint.   Past Medical History:  Diagnosis Date   Crohn's colitis ([place])    Graves disease     Branch Known Allergies   Review of Systems:  Branch headache, visual changes, nausea, vomiting, diarrhea, constipation, dizziness, abdominal pain, skin rash, fevers, chills, night sweats, weight loss, swollen lymph nodes, body aches, joint swelling, chest pain, shortness of breath, mood changes. POSITIVE muscle aches  Objective  Blood pressure 122/88, pulse 84, height 5\' 11"  (1.803 m), weight 206 lb (93.4 kg), SpO2 98 %.   General: Branch apparent distress alert and oriented x3 mood and affect normal, dressed appropriately.  HEENT: Pupils equal, extraocular movements intact  Respiratory: Patient's speak in full sentences and does not appear short of breath  Cardiovascular: Branch lower extremity edema, non tender, Branch erythema  Left knee exam shows that patient does have an effusion noted of the patellofemoral joint.  Lacks last 15  degrees of flexion.  Patient does have some tenderness to palpation noted in this area.  Lateral tracking Noted.  Osteopathic findings  C6 flexed rotated and side bent left T3 extended rotated and side bent right inhaled rib T9 extended rotated and side bent left L2 flexed rotated and side bent right Sacrum right on right   Limited muscular skeletal ultrasound was performed and interpreted by Hulan Saas, M   Limited ultrasound of patient's left knee shows the patient does have significant hypoechoic changes of the patellofemoral joint, mild narrowing of the patellofemoral joint noted.  Branch significant meniscus injury noted though.  After informed written and verbal consent, patient was seated on exam table. Left knee was prepped with alcohol swab and utilizing anterolateral approach, patient's left knee space was injected with 4:1  marcaine 0.5%: Kenalog 40mg /dL. Patient tolerated the procedure well without immediate complications.    Assessment and Plan:  Degenerative arthritis of left knee Injection given today, tolerated the procedure well, seems to be secondary to patient increasing activity.  Follow-up again in 6 to 8 weeks.  Worsening pain consider viscosupplementation    Nonallopathic problems  Decision today to treat with OMT was based on Physical Exam  After verbal consent patient was treated with HVLA, ME, FPR techniques in cervical, rib, thoracic, lumbar, and sacral  areas  Patient tolerated the procedure well with improvement in symptoms  Patient given exercises, stretches and lifestyle modifications  See medications in patient instructions if given  Patient will follow up in 4-8 weeks    The above documentation has  been reviewed and is accurate and complete [name], DO          Note: This dictation was prepared with Dragon dictation along with smaller phrase technology. Any transcriptional errors that result from this process are unintentional.

## 2022-12-11 NOTE — Patient Instructions (Signed)
Injected and drained knee today See me in 6 weeks

## 2022-12-11 NOTE — Assessment & Plan Note (Signed)
Injection given today, tolerated the procedure well, seems to be secondary to patient increasing activity.  Follow-up again in 6 to 8 weeks.  Worsening pain consider viscosupplementation

## 2023-01-02 ENCOUNTER — Ambulatory Visit: Payer: Managed Care, Other (non HMO) | Admitting: Family Medicine

## 2023-01-22 NOTE — Progress Notes (Unsigned)
[facility] [place] Phone: [phone] Subjective:   Raymond Branch, am serving as a scribe for Dr. Hulan Branch.  I'm seeing this patient by the request  of:  Rankins, Raymond Salinas, MD  CC: back and neck pain follow up   [id number]  Raymond Branch. is a 37 y.o. male coming in with complaint of back and neck pain. OMT on 12/11/2022. Also seen for L knee pain Patient states has been doing relatively well.  Branch flares of patient's stomach.  Branch flares of worsening discomfort or pain either.  Patient feels like he is in a relatively good spot at the moment.  Medications patient has been prescribed:   Taking:         Reviewed prior external information including notes and imaging from previsou exam, outside providers and external EMR if available.   As well as notes that were available from care everywhere and other healthcare systems.  Past medical history, social, surgical and family history all reviewed in electronic medical record.  Branch pertanent information unless stated regarding to the chief complaint.   Past Medical History:  Diagnosis Date   Crohn's colitis ([place])    Graves disease     Branch Known Allergies   Review of Systems:  Branch headache, visual changes, nausea, vomiting, diarrhea, constipation, dizziness, abdominal pain, skin rash, fevers, chills, night sweats, weight loss, swollen lymph nodes, body aches, joint swelling, chest pain, shortness of breath, mood changes. POSITIVE muscle aches  Objective  Blood pressure 120/88, pulse 68, height 5\' 11"  (1.803 m), weight 209 lb (94.8 kg), SpO2 98 %.   General: Branch apparent distress alert and oriented x3 mood and affect normal, dressed appropriately.  HEENT: Pupils equal, extraocular movements intact  Respiratory: Patient's speak in full sentences and does not appear short of breath  Cardiovascular: Branch lower extremity edema, non tender, Branch erythema  MSK:   Back low back exam does have some mild loss of lordosis.  Some tenderness to palpation in the paraspinal musculature.  Tightness noted with flexion extension with some tightness of the hamstring.  Osteopathic findings  C3 flexed rotated and side bent right C7 flexed rotated and side bent left T8 extended rotated and side bent left L2 flexed rotated and side bent right Sacrum right on right       Assessment and Plan:  Low back pain Low back exam does have some loss of lordosis noted.  Some tenderness to palpation in the paraspinal musculature.  Patient will continue to work on core strengthening.  Discussed icing regimen and home exercises.  Discussed icing regimen if necessary on a more regular basis.  Branch significant change in medications.  Follow-up again in 6 to 8 weeks    Nonallopathic problems  Decision today to treat with OMT was based on Physical Exam  After verbal consent patient was treated with HVLA, ME, FPR techniques in cervical, thoracic, lumbar, and sacral  areas  Patient tolerated the procedure well with improvement in symptoms  Patient given exercises, stretches and lifestyle modifications  See medications in patient instructions if given  Patient will follow up in 4-8 weeks    The above documentation has been reviewed and is accurate and complete Raymond Pulley, DO          Note: This dictation was prepared with Dragon dictation along with smaller phrase technology. Any transcriptional errors that result from this process are unintentional.

## 2023-01-23 ENCOUNTER — Encounter: Payer: Self-pay | Admitting: Family Medicine

## 2023-01-23 ENCOUNTER — Ambulatory Visit: Payer: Managed Care, Other (non HMO) | Admitting: Family Medicine

## 2023-01-23 VITALS — BP 120/88 | HR 68 | Ht 71.0 in | Wt 209.0 lb

## 2023-01-23 DIAGNOSIS — M9902 Segmental and somatic dysfunction of thoracic region: Secondary | ICD-10-CM

## 2023-01-23 DIAGNOSIS — M545 Low back pain, unspecified: Secondary | ICD-10-CM

## 2023-01-23 DIAGNOSIS — G8929 Other chronic pain: Secondary | ICD-10-CM

## 2023-01-23 DIAGNOSIS — M9901 Segmental and somatic dysfunction of cervical region: Secondary | ICD-10-CM

## 2023-01-23 DIAGNOSIS — M9903 Segmental and somatic dysfunction of lumbar region: Secondary | ICD-10-CM | POA: Diagnosis not present

## 2023-01-23 DIAGNOSIS — M9904 Segmental and somatic dysfunction of sacral region: Secondary | ICD-10-CM | POA: Diagnosis not present

## 2023-01-23 NOTE — Patient Instructions (Signed)
Good to see you! Don't change a thing See you again in 2-3 months

## 2023-01-23 NOTE — Assessment & Plan Note (Signed)
Low back exam does have some loss of lordosis noted.  Some tenderness to palpation in the paraspinal musculature.  Patient will continue to work on core strengthening.  Discussed icing regimen and home exercises.  Discussed icing regimen if necessary on a more regular basis.  No significant change in medications.  Follow-up again in 6 to 8 weeks

## 2023-04-08 NOTE — Progress Notes (Unsigned)
[name] [address] [place] [address] Phone: [phone] Subjective:   [name], am serving as a scribe for Dr. Antoine Primas.  I'm seeing this patient by the request  of:  Rankins, [name], MD  CC: Back and neck pain follow-up  [id number]  Raymond Branch. is a 37 y.o. male coming in with complaint of back and neck pain. OMT On 01/23/2023. Patient states doing well. Would like you to take a look at bottom of left foot. Ankles in the middle of the night are sore, but in the morning doing better. Not painful. Also feels like recovery time for breathing is getting harder. Not sure where that is coming from.  Medications patient has been prescribed:   Taking:         Reviewed prior external information including notes and imaging from previsou exam, outside providers and external EMR if available.   As well as notes that were available from care everywhere and other healthcare systems.  Past medical history, social, surgical and family history all reviewed in electronic medical record.  No pertanent information unless stated regarding to the chief complaint.   Past Medical History:  Diagnosis Date   Crohn's colitis ([facility])    Graves disease     No Known Allergies   Review of Systems:  No headache, visual changes, nausea, vomiting, diarrhea, constipation, dizziness, abdominal pain, skin rash, fevers, chills, night sweats, weight loss, swollen lymph nodes, body aches, joint swelling, chest pain, shortness of breath, mood changes. POSITIVE muscle aches  Objective  Blood pressure 122/86, pulse 72, height 5\' 11"  (1.803 m), weight 206 lb (93.4 kg), SpO2 98 %.   General: No apparent distress alert and oriented x3 mood and affect normal, dressed appropriately.  HEENT: Pupils equal, extraocular movements intact  Respiratory: Patient's speak in full sentences and does not appear short of breath  Cardiovascular: No lower  extremity edema, non tender, no erythema  Back exam does have some mild loss of lordosis noted.  The patient does have some tightness noted with FABER test bilaterally.  Right ankle does have fullness noted on the lateral aspect of the ankle.  No significant instability noted.  Osteopathic findings  C6 flexed rotated and side bent left T3 extended rotated and side bent right inhaled rib T7 extended rotated and side bent left L2 flexed rotated and side bent right Sacrum right on right       Assessment and Plan:  Other bursal cyst, right ankle and foot Has had swelling of this cyst previously.  Discussed with patient about the possibility of removing type of injection if necessary.  Discussed icing regimen and home exercises.  Patient's ankle does have some tightness but nothing that should be severe enough to stop him from activity.  Will follow-up with me again in 6 to 8 weeks otherwise.  Low back pain Chronic, with mild discomfort noted.  Discussed which activities to do and which ones to avoid.  Continue to work on core strengthening.  Patient has had some fatigue recently and we will start a regular exercise routine and see how he responds.  If worsening pain noted consider advanced imaging but hopeful that patient will do well.    Nonallopathic problems  Decision today to treat with OMT was based on Physical Exam  After verbal consent patient was treated with HVLA, ME, FPR techniques in cervical, rib, thoracic, lumbar, and sacral  areas  Patient tolerated the procedure well  with improvement in symptoms  Patient given exercises, stretches and lifestyle modifications  See medications in patient instructions if given  Patient will follow up in 4-8 weeks    The above documentation has been reviewed and is accurate and complete Judi Saa, DO          Note: This dictation was prepared with Dragon dictation along with smaller phrase technology. Any transcriptional  errors that result from this process are unintentional.

## 2023-04-09 ENCOUNTER — Ambulatory Visit: Payer: Managed Care, Other (non HMO) | Admitting: Family Medicine

## 2023-04-09 VITALS — BP 122/86 | HR 72 | Ht 71.0 in | Wt 206.0 lb

## 2023-04-09 DIAGNOSIS — M9903 Segmental and somatic dysfunction of lumbar region: Secondary | ICD-10-CM | POA: Diagnosis not present

## 2023-04-09 DIAGNOSIS — M545 Low back pain, unspecified: Secondary | ICD-10-CM

## 2023-04-09 DIAGNOSIS — M71371 Other bursal cyst, right ankle and foot: Secondary | ICD-10-CM | POA: Diagnosis not present

## 2023-04-09 DIAGNOSIS — M9908 Segmental and somatic dysfunction of rib cage: Secondary | ICD-10-CM | POA: Diagnosis not present

## 2023-04-09 DIAGNOSIS — G8929 Other chronic pain: Secondary | ICD-10-CM

## 2023-04-09 DIAGNOSIS — M9904 Segmental and somatic dysfunction of sacral region: Secondary | ICD-10-CM

## 2023-04-09 DIAGNOSIS — M9902 Segmental and somatic dysfunction of thoracic region: Secondary | ICD-10-CM

## 2023-04-09 DIAGNOSIS — M9901 Segmental and somatic dysfunction of cervical region: Secondary | ICD-10-CM

## 2023-04-09 NOTE — Patient Instructions (Signed)
Oofos in house Make sure hear rates drops 12 beats within first minute of working out Stick to a routine See you again in 2 months

## 2023-04-09 NOTE — Assessment & Plan Note (Signed)
Chronic, with mild discomfort noted.  Discussed which activities to do and which ones to avoid.  Continue to work on core strengthening.  Patient has had some fatigue recently and we will start a regular exercise routine and see how he responds.  If worsening pain noted consider advanced imaging but hopeful that patient will do well.

## 2023-04-09 NOTE — Assessment & Plan Note (Signed)
Has had swelling of this cyst previously.  Discussed with patient about the possibility of removing type of injection if necessary.  Discussed icing regimen and home exercises.  Patient's ankle does have some tightness but nothing that should be severe enough to stop him from activity.  Will follow-up with me again in 6 to 8 weeks otherwise.

## 2023-06-05 NOTE — Progress Notes (Signed)
  [name] [address] [place] [address] Phone: [phone] Subjective:   Raymond Branch, am serving as a scribe for Dr. Antoine Primas.  I'm seeing this patient by the request  of:  Rankins, [name], MD  CC: Low back pain follow-up  [id number]  Raymond Branch. is a 37 y.o. male coming in with complaint of back and neck pain. OMT on 04/09/2023. Patient states same per usual. No new concerns.  Has been relatively active.  He is going to start doing more routine with work.  Not working not quite as regularly.          Reviewed prior external information including notes and imaging from previsou exam, outside providers and external EMR if available.   As well as notes that were available from care everywhere and other healthcare systems.  Past medical history, social, surgical and family history all reviewed in electronic medical record.  No pertanent information unless stated regarding to the chief complaint.   Past Medical History:  Diagnosis Date   Crohn's colitis ([facility])    Graves disease     No Known Allergies   Review of Systems:  No headache, visual changes, nausea, vomiting, diarrhea, constipation, dizziness, abdominal pain, skin rash, fevers, chills, night sweats, weight loss, swollen lymph nodes, body aches, joint swelling, chest pain, shortness of breath, mood changes. POSITIVE muscle aches  Objective  Blood pressure (!) 118/92, pulse 79, height 5\' 11"  (1.803 m), weight 208 lb (94.3 kg), SpO2 96%.   General: No apparent distress alert and oriented x3 mood and affect normal, dressed appropriately.  HEENT: Pupils equal, extraocular movements intact  Respiratory: Patient's speak in full sentences and does not appear short of breath  Cardiovascular: No lower extremity edema, non tender, no erythema  Back exam does have some mild loss lordosis noted.  Some tenderness to palpation in the paraspinal  musculature.  Osteopathic findings  C2 flexed rotated and side bent right C7 flexed rotated and side bent left T3 extended rotated and side bent right inhaled rib T8 extended rotated and side bent left L2 flexed rotated and side bent right Sacrum right on right       Assessment and Plan:  Low back pain Low back does have some loss of lordosis still noted.  Nothing no severe that is stopping him from activity.  Still working on his weight.  Seems to be though stable over the last 5 months.  Would like to get a down to 185 mL.  Increase activity slowly.  Follow-up again in 6 to 8 weeks    Nonallopathic problems  Decision today to treat with OMT was based on Physical Exam  After verbal consent patient was treated with HVLA, ME, FPR techniques in cervical, rib, thoracic, lumbar, and sacral  areas  Patient tolerated the procedure well with improvement in symptoms  Patient given exercises, stretches and lifestyle modifications  See medications in patient instructions if given  Patient will follow up in 4-8 weeks    The above documentation has been reviewed and is accurate and complete Judi Saa, DO          Note: This dictation was prepared with Dragon dictation along with smaller phrase technology. Any transcriptional errors that result from this process are unintentional.

## 2023-06-11 ENCOUNTER — Ambulatory Visit: Payer: Managed Care, Other (non HMO) | Admitting: Family Medicine

## 2023-06-11 ENCOUNTER — Encounter: Payer: Self-pay | Admitting: Family Medicine

## 2023-06-11 VITALS — BP 118/92 | HR 79 | Ht 71.0 in | Wt 208.0 lb

## 2023-06-11 DIAGNOSIS — M9908 Segmental and somatic dysfunction of rib cage: Secondary | ICD-10-CM | POA: Diagnosis not present

## 2023-06-11 DIAGNOSIS — M9904 Segmental and somatic dysfunction of sacral region: Secondary | ICD-10-CM | POA: Diagnosis not present

## 2023-06-11 DIAGNOSIS — M9902 Segmental and somatic dysfunction of thoracic region: Secondary | ICD-10-CM | POA: Diagnosis not present

## 2023-06-11 DIAGNOSIS — M545 Low back pain, unspecified: Secondary | ICD-10-CM | POA: Diagnosis not present

## 2023-06-11 DIAGNOSIS — M9901 Segmental and somatic dysfunction of cervical region: Secondary | ICD-10-CM

## 2023-06-11 DIAGNOSIS — M9903 Segmental and somatic dysfunction of lumbar region: Secondary | ICD-10-CM | POA: Diagnosis not present

## 2023-06-11 DIAGNOSIS — G8929 Other chronic pain: Secondary | ICD-10-CM

## 2023-06-11 NOTE — Assessment & Plan Note (Signed)
Low back does have some loss of lordosis still noted.  Nothing no severe that is stopping him from activity.  Still working on his weight.  Seems to be though stable over the last 5 months.  Would like to get a down to 185 mL.  Increase activity slowly.  Follow-up again in 6 to 8 weeks

## 2023-06-11 NOTE — Patient Instructions (Addendum)
Good to see you! Do prescribed exercises at least 3x a week  See you again in 8-10 weeks

## 2023-08-15 NOTE — Progress Notes (Signed)
[facility] [address] [place] [address] Phone: [phone] Subjective:   [name], am serving as a scribe for Dr. Antoine Primas.  I'm seeing this patient by the request  of:  Rankins, [name], MD  CC: Left elbow pain, back pain follow-up  [name]  Raymond Branch. is a 37 y.o. male coming in with complaint of back and neck pain. OMT on 06/11/2023. Patient states L elbow pain. Thinks its tendonitis. Happening for 6-8 weeks no improvement no home therapy.  Medications patient has been prescribed:   Taking:         Reviewed prior external information including notes and imaging from previsou exam, outside providers and external EMR if available.   As well as notes that were available from care everywhere and other healthcare systems.  Past medical history, social, surgical and family history all reviewed in electronic medical record.  No pertanent information unless stated regarding to the chief complaint.   Past Medical History:  Diagnosis Date   Crohn's colitis ([facility])    Graves disease     No Known Allergies   Review of Systems:  No headache, visual changes, nausea, vomiting, diarrhea, constipation, dizziness, abdominal pain, skin rash, fevers, chills, night sweats, weight loss, swollen lymph nodes, body aches, joint swelling, chest pain, shortness of breath, mood changes. POSITIVE muscle aches  Objective  Blood pressure 126/84, pulse (!) 113, height 5\' 11"  (1.803 m), weight 200 lb (90.7 kg), SpO2 98%.   General: No apparent distress alert and oriented x3 mood and affect normal, dressed appropriately.  HEENT: Pupils equal, extraocular movements intact  Respiratory: Patient's speak in full sentences and does not appear short of breath  Cardiovascular: No lower extremity edema, non tender, no erythema  Left elbow does have tenderness to palpation in the lateral epicondylar region.  Osteopathic findings  C2  flexed rotated and side bent right C7 flexed rotated and side bent left T3 extended rotated and side bent right inhaled rib L2 flexed rotated and side bent right L5 flexed rotated and side bent left Sacrum right on right   Limited muscular skeletal ultrasound was performed and interpreted by Antoine Primas, M  Limited ultrasound shows some hypoechoic changes of the tendon with some increasing in neovascularization of the common extensor tendon but no true tear appreciated.  Management potentially scar tissue formation noted.    Assessment and Plan:  Low back pain Chronic but stable, continues to respond well though to osteopathic manipulation.  Discussed icing regimen and home exercises, increase activity slowly otherwise.  Follow-up with me again in 6 to 8 weeks.  Left lateral epicondylitis Elbow anatomy was reviewed, and tendinopathy was explained.  Pt. given a home rehab program. Start with isometrics and ROM, then a series of concentric and eccentric exercises should be done starting with no weight, work up to 1 lb, hammer, etc.  Use counterforce strap if working or using hands.  Formal PT would be beneficial. Emphasized stretching an cross-friction massage Emphasized proper palms up lifting biomechanics to unload ECRB    Nonallopathic problems  Decision today to treat with OMT was based on Physical Exam  After verbal consent patient was treated with HVLA, ME, FPR techniques in cervical, rib, thoracic, lumbar, and sacral  areas  Patient tolerated the procedure well with improvement in symptoms  Patient given exercises, stretches and lifestyle modifications  See medications in patient instructions if given  Patient will follow up in 4-8 weeks  The above documentation has been reviewed and is accurate and complete Judi Saa, DO          Note: This dictation was prepared with Dragon dictation along with smaller phrase technology. Any transcriptional errors  that result from this process are unintentional.

## 2023-08-20 ENCOUNTER — Ambulatory Visit: Payer: Managed Care, Other (non HMO) | Admitting: Family Medicine

## 2023-08-20 ENCOUNTER — Encounter: Payer: Self-pay | Admitting: Family Medicine

## 2023-08-20 ENCOUNTER — Other Ambulatory Visit: Payer: Self-pay

## 2023-08-20 VITALS — BP 126/84 | HR 113 | Ht 71.0 in | Wt 200.0 lb

## 2023-08-20 DIAGNOSIS — M9904 Segmental and somatic dysfunction of sacral region: Secondary | ICD-10-CM | POA: Diagnosis not present

## 2023-08-20 DIAGNOSIS — M9908 Segmental and somatic dysfunction of rib cage: Secondary | ICD-10-CM

## 2023-08-20 DIAGNOSIS — G8929 Other chronic pain: Secondary | ICD-10-CM

## 2023-08-20 DIAGNOSIS — M9901 Segmental and somatic dysfunction of cervical region: Secondary | ICD-10-CM | POA: Diagnosis not present

## 2023-08-20 DIAGNOSIS — M25522 Pain in left elbow: Secondary | ICD-10-CM | POA: Diagnosis not present

## 2023-08-20 DIAGNOSIS — M545 Low back pain, unspecified: Secondary | ICD-10-CM

## 2023-08-20 DIAGNOSIS — M7712 Lateral epicondylitis, left elbow: Secondary | ICD-10-CM | POA: Insufficient documentation

## 2023-08-20 DIAGNOSIS — M9902 Segmental and somatic dysfunction of thoracic region: Secondary | ICD-10-CM | POA: Diagnosis not present

## 2023-08-20 DIAGNOSIS — M9903 Segmental and somatic dysfunction of lumbar region: Secondary | ICD-10-CM

## 2023-08-20 NOTE — Assessment & Plan Note (Signed)
Chronic but stable, continues to respond well though to osteopathic manipulation.  Discussed icing regimen and home exercises, increase activity slowly otherwise.  Follow-up with me again in 6 to 8 weeks.

## 2023-08-20 NOTE — Assessment & Plan Note (Signed)
Elbow anatomy was reviewed, and tendinopathy was explained.  Pt. given a home rehab program. Start with isometrics and ROM, then a series of concentric and eccentric exercises should be done starting with no weight, work up to 1 lb, hammer, etc.  Use counterforce strap if working or using hands.  Formal PT would be beneficial. Emphasized stretching an cross-friction massage Emphasized proper palms up lifting biomechanics to unload ECRB

## 2023-08-20 NOTE — Patient Instructions (Signed)
Do prescribed exercises at least 3x a week Graston tool Thumbs up or under hand See you again in 6-8 weeks

## 2023-09-27 NOTE — Progress Notes (Signed)
  [name] [address] [place] [address] Phone: [phone] Subjective:   [name], am serving as a scribe for Dr. Antoine Primas.  I'm seeing this patient by the request  of:  Rankins, [name], MD  CC: back and knee pain   [id number]  Raymond Branch. is a 37 y.o. male coming in with complaint of back and neck pain. OMT on 08/20/2023. Patient states same per usual. Take a look at left knee. Was doing squats.          Reviewed prior external information including notes and imaging from previsou exam, outside providers and external EMR if available.   As well as notes that were available from care everywhere and other healthcare systems.  Past medical history, social, surgical and family history all reviewed in electronic medical record.  No pertanent information unless stated regarding to the chief complaint.   Past Medical History:  Diagnosis Date   Crohn's colitis (HCC)    Graves disease     No Known Allergies   Review of Systems:  No headache, visual changes, nausea, vomiting, diarrhea, constipation, dizziness, abdominal pain, skin rash, fevers, chills, night sweats, weight loss, swollen lymph nodes, body aches, joint swelling, chest pain, shortness of breath, mood changes. POSITIVE muscle aches  Objective  Blood pressure 118/74, pulse (!) 106, height 5\' 11"  (1.803 m), weight 206 lb (93.4 kg), SpO2 96%.   General: No apparent distress alert and oriented x3 mood and affect normal, dressed appropriately.  HEENT: Pupils equal, extraocular movements intact  Respiratory: Patient's speak in full sentences and does not appear short of breath  Cardiovascular: No lower extremity edema, non tender, no erythema  MSK:  Back does have loss of lordosis tightness in the lumbar area.  Left knee instability noted.    Osteopathic findings  C2 flexed rotated and side bent right C4 flexed rotated and side bent left T3 extended  rotated and side bent right inhaled rib T9 extended rotated and side bent left L1 flexed rotated and side bent right Sacrum right on right      Assessment and Plan:  Degenerative arthritis of left knee Instability noted, medial unloader brace given    Low back pain Low back pain that might be aggravated secondary to compensation for some of the knee.  Does respond relatively well though to osteopathic manipulation.  Discussed icing regimen and home exercises.  Follow-up again in 6 to 8 weeks    Nonallopathic problems  Decision today to treat with OMT was based on Physical Exam  After verbal consent patient was treated with HVLA, ME, FPR techniques in cervical, rib, thoracic, lumbar, and sacral  areas  Patient tolerated the procedure well with improvement in symptoms  Patient given exercises, stretches and lifestyle modifications  See medications in patient instructions if given  Patient will follow up in 4-8 weeks     The above documentation has been reviewed and is accurate and complete Judi Saa, DO         Note: This dictation was prepared with Dragon dictation along with smaller phrase technology. Any transcriptional errors that result from this process are unintentional.

## 2023-10-08 ENCOUNTER — Ambulatory Visit: Payer: Managed Care, Other (non HMO) | Admitting: Family Medicine

## 2023-10-08 ENCOUNTER — Encounter: Payer: Self-pay | Admitting: Family Medicine

## 2023-10-08 VITALS — BP 118/74 | HR 106 | Ht 71.0 in | Wt 206.0 lb

## 2023-10-08 DIAGNOSIS — M9901 Segmental and somatic dysfunction of cervical region: Secondary | ICD-10-CM | POA: Diagnosis not present

## 2023-10-08 DIAGNOSIS — M545 Low back pain, unspecified: Secondary | ICD-10-CM

## 2023-10-08 DIAGNOSIS — M9904 Segmental and somatic dysfunction of sacral region: Secondary | ICD-10-CM

## 2023-10-08 DIAGNOSIS — M9908 Segmental and somatic dysfunction of rib cage: Secondary | ICD-10-CM

## 2023-10-08 DIAGNOSIS — M1712 Unilateral primary osteoarthritis, left knee: Secondary | ICD-10-CM

## 2023-10-08 DIAGNOSIS — M9902 Segmental and somatic dysfunction of thoracic region: Secondary | ICD-10-CM

## 2023-10-08 DIAGNOSIS — G8929 Other chronic pain: Secondary | ICD-10-CM

## 2023-10-08 DIAGNOSIS — M9903 Segmental and somatic dysfunction of lumbar region: Secondary | ICD-10-CM

## 2023-10-08 NOTE — Patient Instructions (Signed)
Good to see you!  Wear brace with lower body days You have 14 days to return or exchange your brace Call [phone], then return the brace to our office  See you again in 7-8 weeks

## 2023-10-08 NOTE — Assessment & Plan Note (Signed)
Instability noted, medial unloader brace given

## 2023-10-08 NOTE — Assessment & Plan Note (Signed)
Low back pain that might be aggravated secondary to compensation for some of the knee.  Does respond relatively well though to osteopathic manipulation.  Discussed icing regimen and home exercises.  Follow-up again in 6 to 8 weeks

## 2023-11-26 NOTE — Progress Notes (Unsigned)
  [name] [address] [place] [address] Phone: [phone] Subjective:   I, Raymond Branch, am serving as a scribe for Dr. Antoine Primas. + I'm seeing this patient by the request  of:  Rankins, [name], MD  CC: Back and neck pain follow-up  [id number]  Raymond Branch. is a 38 y.o. male coming in with complaint of back and neck pain. OMT on 10/08/2023. Patient states same per usual.  Has been doing relatively well.  Medications patient has been prescribed:   Taking:         Reviewed prior external information including notes and imaging from previsou exam, outside providers and external EMR if available.   As well as notes that were available from care everywhere and other healthcare systems.  Past medical history, social, surgical and family history all reviewed in electronic medical record.  No pertanent information unless stated regarding to the chief complaint.   Past Medical History:  Diagnosis Date   Crohn's colitis (HCC)    Graves disease     No Known Allergies   Review of Systems:  No headache, visual changes, nausea, vomiting, diarrhea, constipation, dizziness, abdominal pain, skin rash, fevers, chills, night sweats, weight loss, swollen lymph nodes, body aches, joint swelling, chest pain, shortness of breath, mood changes. POSITIVE muscle aches  Objective  Blood pressure 118/82, pulse 92, height 5\' 11"  (1.803 m), weight 203 lb (92.1 kg), SpO2 96%.   General: No apparent distress alert and oriented x3 mood and affect normal, dressed appropriately.  HEENT: Pupils equal, extraocular movements intact  Respiratory: Patient's speak in full sentences and does not appear short of breath  Cardiovascular: No lower extremity edema, non tender, no erythema  MSK:  Back does have some loss of lordosis tightness of in the faber patient does have tightness in her neck.   Osteopathic findings C6 flexed rotated and side bent  left T3 extended rotated and side bent right inhaled rib T9 extended rotated and side bent left L2 flexed rotated and side bent right Sacrum right on right     Assessment and Plan:  Low back pain Loss of lordosis  Tightness on the right  .  Negative straight leg test noted.  No change in medication.  Continue to stay active.  Follow-up again in 2 months    Nonallopathic problems  Decision today to treat with OMT was based on Physical Exam  After verbal consent patient was treated with HVLA, ME, FPR techniques in cervical, rib, thoracic, lumbar, and sacral  areas  Patient tolerated the procedure well with improvement in symptoms  Patient given exercises, stretches and lifestyle modifications  See medications in patient instructions if given  Patient will follow up in 4-8 weeks     The above documentation has been reviewed and is accurate and complete Judi Saa, DO         Note: This dictation was prepared with Dragon dictation along with smaller phrase technology. Any transcriptional errors that result from this process are unintentional.

## 2023-11-28 ENCOUNTER — Encounter: Payer: Self-pay | Admitting: Family Medicine

## 2023-11-28 ENCOUNTER — Ambulatory Visit: Payer: Managed Care, Other (non HMO) | Admitting: Family Medicine

## 2023-11-28 VITALS — BP 118/82 | HR 92 | Ht 71.0 in | Wt 203.0 lb

## 2023-11-28 DIAGNOSIS — M9903 Segmental and somatic dysfunction of lumbar region: Secondary | ICD-10-CM | POA: Diagnosis not present

## 2023-11-28 DIAGNOSIS — M9901 Segmental and somatic dysfunction of cervical region: Secondary | ICD-10-CM | POA: Diagnosis not present

## 2023-11-28 DIAGNOSIS — M9904 Segmental and somatic dysfunction of sacral region: Secondary | ICD-10-CM | POA: Diagnosis not present

## 2023-11-28 DIAGNOSIS — M545 Low back pain, unspecified: Secondary | ICD-10-CM

## 2023-11-28 DIAGNOSIS — M9908 Segmental and somatic dysfunction of rib cage: Secondary | ICD-10-CM

## 2023-11-28 DIAGNOSIS — M9902 Segmental and somatic dysfunction of thoracic region: Secondary | ICD-10-CM | POA: Diagnosis not present

## 2023-11-28 DIAGNOSIS — G8929 Other chronic pain: Secondary | ICD-10-CM

## 2023-11-28 NOTE — Assessment & Plan Note (Signed)
Loss of lordosis  Tightness on the right  .  Negative straight leg test noted.  No change in medication.  Continue to stay active.  Follow-up again in 2 months

## 2024-01-13 NOTE — Progress Notes (Unsigned)
 [facility] [address] [place] [address] Phone: [phone] Subjective:   Raymond Branch, am serving as a scribe for Dr. Antoine [facility].  I'm seeing this patient by the request  of:  Rankins, Raymond Dance, MD  CC: Back and neck pain follow-up  [id number]  Raymond Branch. is a 38 y.o. male coming in with complaint of back and neck pain. OMT 11/28/2023. Patient states doing well. Pains about the same. No new symptoms.  Medications patient has been prescribed: None  Taking:         Reviewed prior external information including notes and imaging from previsou exam, outside providers and external EMR if available.   As well as notes that were available from care everywhere and other healthcare systems.  Past medical history, social, surgical and family history all reviewed in electronic medical record.  No pertanent information unless stated regarding to the chief complaint.   Past Medical History:  Diagnosis Date   Crohn's colitis (HCC)    Graves disease     No Known Allergies   Review of Systems:  No headache, visual changes, nausea, vomiting, diarrhea, constipation, dizziness, abdominal pain, skin rash, fevers, chills, night sweats, weight loss, swollen lymph nodes, body aches, joint swelling, chest pain, shortness of breath, mood changes. POSITIVE muscle aches  Objective  Blood pressure 116/78, pulse 93, height 5\' 11"  (1.803 m), weight 203 lb (92.1 kg), SpO2 99%.   General: No apparent distress alert and oriented x3 mood and affect normal, dressed appropriately.  HEENT: Pupils equal, extraocular movements intact  Respiratory: Patient's speak in full sentences and does not appear short of breath  Cardiovascular: No lower extremity edema, non tender, no erythema  Gait relatively normal MSK:  Back tenderness noted in the paraspinal musculature mostly at the thoracolumbar juncture. Patient's skin findings do show that on the  extensor surfaces of the knees, ankles, wrist and elbows does have some plaque-like changes noted.  Osteopathic findings  C2 flexed rotated and side bent right C6 flexed rotated and side bent left T3 extended rotated and side bent right inhaled rib T9 extended rotated and side bent left L1 flexed rotated and side bent right Sacrum right on right       Assessment and Plan:  Low back pain Chronic problem with more of the right-sided discomfort noted.  Discussed icing regimen and home exercises, discussed which activities to do and which ones to avoid.  Patient may be having so much flare than what we have seen previously and especially with some of the skin changes noted recently.  Seems to be more on the extensor surfaces.  Given a strong topical steroid that patient has responded to previously.  Discussed icing regimen of home exercises otherwise.  Increase activity slowly.  Follow-up again in 6 to 8 weeks    Nonallopathic problems  Decision today to treat with OMT was based on Physical Exam  After verbal consent patient was treated with HVLA, ME, FPR techniques in cervical, rib, thoracic, lumbar, and sacral  areas  Patient tolerated the procedure well with improvement in symptoms  Patient given exercises, stretches and lifestyle modifications  See medications in patient instructions if given  Patient will follow up in 4-8 weeks    The above documentation has been reviewed and is accurate and complete Raymond Saa, DO          Note: This dictation was prepared with Dragon dictation along with smaller phrase technology. Any  transcriptional errors that result from this process are unintentional.

## 2024-01-14 ENCOUNTER — Ambulatory Visit: Payer: Managed Care, Other (non HMO) | Admitting: Family Medicine

## 2024-01-14 ENCOUNTER — Encounter: Payer: Self-pay | Admitting: Family Medicine

## 2024-01-14 VITALS — BP 116/78 | HR 93 | Ht 71.0 in | Wt 203.0 lb

## 2024-01-14 DIAGNOSIS — M9901 Segmental and somatic dysfunction of cervical region: Secondary | ICD-10-CM

## 2024-01-14 DIAGNOSIS — M9902 Segmental and somatic dysfunction of thoracic region: Secondary | ICD-10-CM | POA: Diagnosis not present

## 2024-01-14 DIAGNOSIS — M9903 Segmental and somatic dysfunction of lumbar region: Secondary | ICD-10-CM

## 2024-01-14 DIAGNOSIS — M9908 Segmental and somatic dysfunction of rib cage: Secondary | ICD-10-CM | POA: Diagnosis not present

## 2024-01-14 DIAGNOSIS — G8929 Other chronic pain: Secondary | ICD-10-CM

## 2024-01-14 DIAGNOSIS — M9904 Segmental and somatic dysfunction of sacral region: Secondary | ICD-10-CM

## 2024-01-14 DIAGNOSIS — M545 Low back pain, unspecified: Secondary | ICD-10-CM | POA: Diagnosis not present

## 2024-01-14 MED ORDER — CLOBETASOL PROPIONATE 0.05 % EX CREA: 1.0000 | TOPICAL_CREAM | Freq: Two times a day (BID) | CUTANEOUS | 2 refills | Status: AC

## 2024-01-14 NOTE — Assessment & Plan Note (Signed)
 Chronic problem with more of the right-sided discomfort noted.  Discussed icing regimen and home exercises, discussed which activities to do and which ones to avoid.  Patient may be having so much flare than what we have seen previously and especially with some of the skin changes noted recently.  Seems to be more on the extensor surfaces.  Given a strong topical steroid that patient has responded to previously.  Discussed icing regimen of home exercises otherwise.  Increase activity slowly.  Follow-up again in 6 to 8 weeks

## 2024-01-14 NOTE — Patient Instructions (Addendum)
Good to see you! ? ?See you again in 2-3 months ?

## 2024-04-28 ENCOUNTER — Ambulatory Visit: Admitting: Family Medicine

## 2024-07-08 ENCOUNTER — Ambulatory Visit: Payer: Self-pay | Admitting: Family Medicine

## 2024-07-08 ENCOUNTER — Encounter: Payer: Self-pay | Admitting: Family Medicine

## 2024-07-08 VITALS — BP 110/86 | HR 81 | Ht 71.0 in | Wt 214.0 lb

## 2024-07-08 DIAGNOSIS — M9903 Segmental and somatic dysfunction of lumbar region: Secondary | ICD-10-CM

## 2024-07-08 DIAGNOSIS — G8929 Other chronic pain: Secondary | ICD-10-CM | POA: Diagnosis not present

## 2024-07-08 DIAGNOSIS — M9902 Segmental and somatic dysfunction of thoracic region: Secondary | ICD-10-CM

## 2024-07-08 DIAGNOSIS — M9908 Segmental and somatic dysfunction of rib cage: Secondary | ICD-10-CM | POA: Diagnosis not present

## 2024-07-08 DIAGNOSIS — M9901 Segmental and somatic dysfunction of cervical region: Secondary | ICD-10-CM | POA: Diagnosis not present

## 2024-07-08 DIAGNOSIS — M9904 Segmental and somatic dysfunction of sacral region: Secondary | ICD-10-CM

## 2024-07-08 DIAGNOSIS — M545 Low back pain, unspecified: Secondary | ICD-10-CM

## 2024-07-08 NOTE — Patient Instructions (Signed)
 Good luck with the parents Upright go necklace Monitor at eye level See me in 8 weeks

## 2024-07-08 NOTE — Assessment & Plan Note (Signed)
 Low back does have some loss of lordosis and some tenderness to palpation in the paraspinal increase activity slowly.  Discussed icing regimen and home exercises, patient is still working on posture.  We discussed with patient to monitor closely.  Follow-up again in 6 to 8 weeks otherwise.

## 2024-07-08 NOTE — Progress Notes (Signed)
  [facility] [address] [place] [address] Phone: [phone] Subjective:   Raymond Branch, am serving as a scribe for Dr. Arthea Claudene.  I'm seeing this patient by the request  of:  Rankins, Raymond [name], MD  CC: Back and neck pain follow-up  [name]  [name] is a 38 y.o. male coming in with complaint of back and neck pain Patient states that he is the same as last visit.   Medications patient has been prescribed:   Taking:         Reviewed prior external information including notes and imaging from previsou exam, outside providers and external EMR if available.   As well as notes that were available from care everywhere and other healthcare systems.  Past medical history, social, surgical and family history all reviewed in electronic medical record.  No pertanent information unless stated regarding to the chief complaint.   Past Medical History:  Diagnosis Date   Crohn's colitis (HCC)    Graves disease     No Known Allergies   Review of Systems:  No headache, visual changes, nausea, vomiting, diarrhea, constipation, dizziness, abdominal pain, skin rash, fevers, chills, night sweats, weight loss, swollen lymph nodes, body aches, joint swelling, chest pain, shortness of breath, mood changes. POSITIVE muscle aches  Objective  Blood pressure 110/86, pulse 81, height 5' 11 (1.803 m), weight 214 lb (97.1 kg), SpO2 97%.   General: No apparent distress alert and oriented x3 mood and affect normal, dressed appropriately.  HEENT: Pupils equal, extraocular movements intact  Respiratory: Patient's speak in full sentences and does not appear short of breath  Cardiovascular: No lower extremity edema, non tender, no erythema  Gait MSK:  Back mild loss lordosis noted.  Still having some difficulty with posture.  Tightness with sidebending bilaterally.  Osteopathic findings  C3 flexed rotated and side bent right T3  extended rotated and side bent right inhaled rib T9 extended rotated and side bent left L2 flexed rotated and side bent right L5 flexed rotated and side bent left Sacrum right on right       Assessment and Plan:  Low back pain Low back does have some loss of lordosis and some tenderness to palpation in the paraspinal increase activity slowly.  Discussed icing regimen and home exercises, patient is still working on posture.  We discussed with patient to monitor closely.  Follow-up again in 6 to 8 weeks otherwise.    Nonallopathic problems  Decision today to treat with OMT was based on Physical Exam  After verbal consent patient was treated with HVLA, ME, FPR techniques in cervical, rib, thoracic, lumbar, and sacral  areas  Patient tolerated the procedure well with improvement in symptoms  Patient given exercises, stretches and lifestyle modifications  See medications in patient instructions if given  Patient will follow up in 4-8 weeks     The above documentation has been reviewed and is accurate and complete [name], DO         Note: This dictation was prepared with Dragon dictation along with smaller phrase technology. Any transcriptional errors that result from this process are unintentional.

## 2024-08-27 NOTE — Progress Notes (Signed)
  [facility] [address] [place] [address] Phone: [phone] Subjective:   LILLETTE [name], am serving as a scribe for Dr. Arthea [facility].  I'm seeing this patient by the request  of:  Rankins, [name], MD  CC: back and neck pain follow up   Raymond  Zyler [facility]. is a 38 y.o. male coming in with complaint of back and neck pain. OMT on 07/08/2024. Patient states continues to have some discomfort in the lower back.  Medications patient has been prescribed:   Taking:         Reviewed prior external information including notes and imaging from previsou exam, outside providers and external EMR if available.   As well as notes that were available from care everywhere and other healthcare systems.  Past medical history, social, surgical and family history all reviewed in electronic medical record.  No pertanent information unless stated regarding to the chief complaint.   Past Medical History:  Diagnosis Date   Crohn's colitis (HCC)    Graves disease     No Known Allergies   Review of Systems:  No headache, visual changes, nausea, vomiting, diarrhea, constipation, dizziness, abdominal pain, skin rash, fevers, chills, night sweats, weight loss, swollen lymph nodes, body aches, joint swelling, chest pain, shortness of breath, mood changes. POSITIVE muscle aches  Objective  Blood pressure (!) 118/90, pulse 78, height 5' 11 (1.803 m), weight 212 lb (96.2 kg), SpO2 99%.   General: No apparent distress alert and oriented x3 mood and affect normal, dressed appropriately.  HEENT: Pupils equal, extraocular movements intact  Respiratory: Patient's speak in full sentences and does not appear short of breath  Cardiovascular: No lower extremity edema, non tender, no erythema  Gait relatively normal MSK:  Back does have some loss lordosis noted.  Some tenderness to palpation in the paraspinal musculature.  Tightness with FABER right  greater than left.  Osteopathic findings  C2 flexed rotated and side bent right T3 extended rotated and side bent right inhaled rib L3 flexed rotated and side bent right Sacrum right on right     Assessment and Plan:  Low back pain Doing better with the core strengthening exercises.  Discussed icing regimen and home exercises, increase activity slowly.  Discussed icing regimen.  Follow-up again in 6 to 12 weeks  Snoring States his wife states that he is snoring more frequently and louder.  Will refer to pulmonology to discuss potential sleep study has had difficulty with low testosterone and fatigue    Nonallopathic problems  Decision today to treat with OMT was based on Physical Exam  After verbal consent patient was treated with HVLA, ME, FPR techniques in cervical, rib, thoracic, lumbar, and sacral  areas  Patient tolerated the procedure well with improvement in symptoms  Patient given exercises, stretches and lifestyle modifications  See medications in patient instructions if given  Patient will follow up in 4-8 weeks     The above documentation has been reviewed and is accurate and complete Raymond CHRISTELLA Claudene, DO         Note: This dictation was prepared with Dragon dictation along with smaller phrase technology. Any transcriptional errors that result from this process are unintentional.

## 2024-09-02 ENCOUNTER — Ambulatory Visit: Admitting: Family Medicine

## 2024-09-02 VITALS — BP 118/90 | HR 78 | Ht 71.0 in | Wt 212.0 lb

## 2024-09-02 DIAGNOSIS — M9901 Segmental and somatic dysfunction of cervical region: Secondary | ICD-10-CM | POA: Diagnosis not present

## 2024-09-02 DIAGNOSIS — M9904 Segmental and somatic dysfunction of sacral region: Secondary | ICD-10-CM | POA: Diagnosis not present

## 2024-09-02 DIAGNOSIS — M9903 Segmental and somatic dysfunction of lumbar region: Secondary | ICD-10-CM

## 2024-09-02 DIAGNOSIS — R0683 Snoring: Secondary | ICD-10-CM | POA: Diagnosis not present

## 2024-09-02 DIAGNOSIS — M9908 Segmental and somatic dysfunction of rib cage: Secondary | ICD-10-CM

## 2024-09-02 DIAGNOSIS — M9902 Segmental and somatic dysfunction of thoracic region: Secondary | ICD-10-CM | POA: Diagnosis not present

## 2024-09-02 DIAGNOSIS — M545 Low back pain, unspecified: Secondary | ICD-10-CM | POA: Diagnosis not present

## 2024-09-02 DIAGNOSIS — G4730 Sleep apnea, unspecified: Secondary | ICD-10-CM

## 2024-09-02 DIAGNOSIS — G8929 Other chronic pain: Secondary | ICD-10-CM

## 2024-09-02 NOTE — Assessment & Plan Note (Signed)
 States his wife states that he is snoring more frequently and louder.  Will refer to pulmonology to discuss potential sleep study has had difficulty with low testosterone and fatigue

## 2024-09-02 NOTE — Patient Instructions (Signed)
 Good to see you. Dhea 50 mg daily for 4 weeks. Referral to Pulmonology for sleep study. See me again in 2 to 3 months.

## 2024-09-02 NOTE — Assessment & Plan Note (Signed)
 Doing better with the core strengthening exercises.  Discussed icing regimen and home exercises, increase activity slowly.  Discussed icing regimen.  Follow-up again in 6 to 12 weeks

## 2024-09-03 ENCOUNTER — Encounter: Payer: Self-pay | Admitting: Family Medicine

## 2024-10-06 ENCOUNTER — Ambulatory Visit (INDEPENDENT_AMBULATORY_CARE_PROVIDER_SITE_OTHER)

## 2024-10-06 ENCOUNTER — Encounter (HOSPITAL_BASED_OUTPATIENT_CLINIC_OR_DEPARTMENT_OTHER): Payer: Self-pay

## 2024-10-06 VITALS — BP 136/89 | HR 83 | Ht 71.0 in | Wt 213.0 lb

## 2024-10-06 DIAGNOSIS — G4719 Other hypersomnia: Secondary | ICD-10-CM | POA: Diagnosis not present

## 2024-10-06 NOTE — Progress Notes (Signed)
 Epworth Sleepiness Scale  Use the following scale to choose the most appropriate number for each situation. 0 Would never nod off 1  Slight  chance of nodding off 2 Moderate chance of nodding off 3 High chance of nodding off  Sitting and reading: 2 Watching TV: 1 Sitting, inactive, in a public place (e.g., in a meeting, theater, or dinner event): 0 As a passenger in a car for an hour or more without stopping for a break: 1 Lying down to rest when circumstances permit:3 Sitting and talking to someone: 0 Sitting quietly after a meal without alcohol: 1 In a car, while stopped for a few  minutes in traffic or at a light: 0  TOTOAL: 8

## 2024-10-06 NOTE — Progress Notes (Signed)
 @Patient  ID: Raymond Branch., male    DOB: 07-19-1986, 38 y.o.   MRN: [id number]  Chief Complaint  Patient presents with   Establish Care    New Sleep     Referring provider: Claudene Arthea HERO, DO  HPI: Discussed the use of AI scribe software for clinical note transcription with the patient, who gave verbal consent to proceed.  History of Present Illness Raymond Branch. Ozell is a 38 year old male with PMH of Crohn's disease and Graves' disease who presents with c/o constant fatigue and snoring for a sleep consult.  He experiences constant fatigue despite sleeping eight to nine hours per night. He goes to bed between 10 and 11 PM and wakes up between 6:30 and 7 AM. He falls asleep quickly, within two minutes, and does not recall waking up during the night, except for occasional bathroom trips. No episodes of waking up short of breath or gasping for air. The fatigue has persisted over the years, initially attributed to Crohn's disease and Graves' disease, but has not improved despite treatment for these conditions.  His wife reports worsening snoring over the past six months, often having to wake him or ask him to change positions. Snoring is worse when he sleeps on his right side. He has not been told that he stops breathing during sleep.  He has a history of Crohn's disease and Graves' disease, both active in 2020, causing his weight to drop to 175 pounds. His usual adult weight is between 195 and 200 pounds, but he is currently over 210 pounds. He underwent sinus surgery for a deviated septum, which was repaired in 2023 and without issues since.  No history of high blood pressure, heart attacks, strokes, heart rhythm issues, palpitations, or shortness of breath. He has never smoked and consumes alcohol a couple of times a week. He does not use drugs or marijuana. His father used a machine for sleep apnea.   TEST/EVENTS : Epworth 9  No Known Allergies   There is no  immunization history on file for this patient.  Past Medical History:  Diagnosis Date   Crohn's colitis (HCC)    Graves disease     Tobacco History: Social History   Tobacco Use  Smoking Status Never  Smokeless Tobacco Never   Counseling given: Not Answered   Outpatient Medications Prior to Visit  Medication Sig Dispense Refill   Ascorbic Acid (VITAMIN C) 1000 MG tablet Take 1,000 mg by mouth daily.     clobetasol  cream (TEMOVATE ) 0.05 % Apply 1 Application topically 2 (two) times daily. 60 g 2   loratadine (CLARITIN) 10 MG tablet Take 10 mg by mouth daily.     Multiple Vitamin (MULTIVITAMIN ADULT PO) Take by mouth.     Adalimumab (HUMIRA, 2 PEN,) 40 MG/0.4ML PNKT Inject into the skin. (Patient not taking: Reported on 10/06/2024)     No facility-administered medications prior to visit.     Review of Systems: as per HPI  Constitutional:   No  weight loss, night sweats,  Fevers, chills, fatigue, or  lassitude.  HEENT:   No headaches,  Difficulty swallowing,  Tooth/dental problems, or  Sore throat,                No sneezing, itching, ear ache, nasal congestion, post nasal drip,   CV:  No chest pain,  Orthopnea, PND, swelling in lower extremities, anasarca, dizziness, palpitations, syncope.   GI  No heartburn, indigestion, abdominal pain, nausea, vomiting,  diarrhea, change in bowel habits, loss of appetite, bloody stools.   Resp: No shortness of breath with exertion or at rest.  No excess mucus, no productive cough,  No non-productive cough,  No coughing up of blood.  No change in color of mucus.  No wheezing.  No chest wall deformity  Skin: no rash or lesions.  GU: no dysuria, change in color of urine, no urgency or frequency.  No flank pain, no hematuria   MS:  No joint pain or swelling.  No decreased range of motion.  No back pain.    Physical Exam  BP 136/89   Pulse 83   Ht 5' 11 (1.803 m)   Wt 213 lb (96.6 kg)   SpO2 95%   BMI 29.71 kg/m   GEN: A/Ox3;  pleasant , NAD, well nourished    HEENT:  [place]/AT,  EACs-clear, TMs-wnl, NOSE-clear, THROAT-clear, no lesions, no postnasal drip or exudate noted. Mallampati 4  NECK:  Supple w/ fair ROM; no JVD; normal carotid impulses w/o bruits; no thyromegaly or nodules palpated; no lymphadenopathy.    RESP  Clear  P & A; w/o, wheezes/ rales/ or rhonchi. no accessory muscle use, no dullness to percussion  CARD:  RRR, no m/r/g, no peripheral edema, pulses intact, no cyanosis or clubbing.  GI:   Soft & nt; nml bowel sounds; no organomegaly or masses detected.   Musco: Warm bil, no deformities or joint swelling noted.   Neuro: alert, no focal deficits noted.    Skin: Warm, no lesions or rashes    Lab Results:  CBC    Component Value Date/Time   WBC 14.6 (H) 06/23/2022 1645   RBC 4.56 06/23/2022 1645   HGB 14.4 06/23/2022 1645   HCT 38.8 (L) 06/23/2022 1645   PLT 210 06/23/2022 1645   MCV 85.1 06/23/2022 1645   MCH 31.6 06/23/2022 1645   MCHC 37.1 (H) 06/23/2022 1645   RDW 12.5 06/23/2022 1645   LYMPHSABS 2.0 06/23/2022 1645   MONOABS 1.4 (H) 06/23/2022 1645   EOSABS 0.0 06/23/2022 1645   BASOSABS 0.0 06/23/2022 1645    BMET    Component Value Date/Time   NA 134 (L) 06/23/2022 1645   K 3.4 (L) 06/23/2022 1645   CL 102 06/23/2022 1645   CO2 23 06/23/2022 1645   GLUCOSE 143 (H) 06/23/2022 1645   BUN 11 06/23/2022 1645   CREATININE 0.69 06/23/2022 1645   CALCIUM 9.2 06/23/2022 1645   GFRNONAA >60 06/23/2022 1645   GFRAA >60 10/22/2019 2018    BNP No results found for: BNP  ProBNP No results found for: PROBNP  Imaging: No results found.  Administration History     None           No data to display          No results found for: NITRICOXIDE   Assessment & Plan:   Assessment & Plan Excessive daytime sleepiness  Assessment and Plan Assessment & Plan Daytime fatigue and hypersomnia with snoring and suspected sleep apnea Chronic fatigue and  hypersomnia with snoring suggest obstructive sleep apnea. Elevated Epworth score supports this. Weight gain may contribute. Other conditions like Crohn's and Graves' are controlled. Family history of sleep apnea noted. - Ordered home sleep test for obstructive sleep apnea evaluation. - Advised on sleep hygiene: side sleeping, head elevation, caffeine and alcohol avoidance, no TV in bed. - Schedule follow-up to discuss sleep test results.   Return in about 7 weeks (around 11/24/2024) for  sleep study review.  Candis Dandy, PA-C 10/06/2024

## 2024-10-06 NOTE — Patient Instructions (Addendum)
 Complete home sleep test; ordered today.  Follow up in 6-8 weeks to review results.  Follow sleep hygiene as discussed.

## 2024-10-15 ENCOUNTER — Encounter (HOSPITAL_BASED_OUTPATIENT_CLINIC_OR_DEPARTMENT_OTHER): Payer: Self-pay

## 2024-10-15 NOTE — Telephone Encounter (Signed)
 Can you help with this?

## 2024-10-26 NOTE — Telephone Encounter (Signed)
 HST was sent to [facility], confirmed receipt and that the equipment has been shipped to Raymond Branch.   Reached out to the patient for confirmation, he stated [facility] contacted him last week, HST equipment is shipped to patient, and acknowledged receipt. No further action needed at this time.

## 2024-10-30 ENCOUNTER — Encounter

## 2024-10-30 DIAGNOSIS — G4719 Other hypersomnia: Secondary | ICD-10-CM

## 2024-11-03 NOTE — Progress Notes (Signed)
 " [name] [address] [place] [address] Phone: [phone] Subjective:   Raymond Branch Raymond Branch, am serving as a scribe for Dr. Arthea Claudene.  I'm seeing this patient by the request  of:  Rankins, [name], MD  CC: Back and neck pain follow-up  [name]  Raymond Branch. is a 38 y.o. male coming in with complaint of back and neck pain. OMT on 09/02/2024. Patient states that his back is ok.   L shoulder pain for past 2 weeks. Pain over anterior aspect. Painful more often than not with overhead motions. Denies any radiating symptoms. No hx of injury.   Medications patient has been prescribed:   Taking:         Reviewed prior external information including notes and imaging from previsou exam, outside providers and external EMR if available.   As well as notes that were available from care everywhere and other healthcare systems.  Past medical history, social, surgical and family history all reviewed in electronic medical record.  No pertanent information unless stated regarding to the chief complaint.   Past Medical History:  Diagnosis Date   Crohn's colitis (HCC)    Graves disease     Allergies[1]   Review of Systems:  No headache, visual changes, nausea, vomiting, diarrhea, constipation, dizziness, abdominal pain, skin rash, fevers, chills, night sweats, weight loss, swollen lymph nodes, body aches, joint swelling, chest pain, shortness of breath, mood changes. POSITIVE muscle aches  Objective  Blood pressure 108/72, pulse 86, height 5' 11 (1.803 m), weight 212 lb (96.2 kg), SpO2 97%.   General: No apparent distress alert and oriented x3 mood and affect normal, dressed appropriately.  HEENT: Pupils equal, extraocular movements intact  Respiratory: Patient's speak in full sentences and does not appear short of breath  Cardiovascular: No lower extremity edema, non tender, no erythema  Gait MSK:  Back low back does have  some loss lordosis noted.  Patient's back does have some limited sidebending noted.  Patient does have left shoulder with positive impingement.  Rotator cuff strength is intact. Left shoulder exam shows positive impingement noted.  Limited muscular skeletal ultrasound was performed and interpreted by CLAUDENE ARTHEA, M  Limited ultrasound shows hypoechoic changes of the subscapularis and the supraspinatus noted..  Significant the left shoulder.  No true tear noted with maybe some mild interstitial tearing noted.  Hypoechoic changes noted in the acromioclavicular joint as well. Impression: Tendinitis with effusion of the left acromioclavicular joint   Osteopathic findings  C2 flexed rotated and side bent right C6 flexed rotated and side bent left T5 extended rotated and side bent left inhaled rib T9 extended rotated and side bent left L1 flexed rotated and side bent right L3 flexed rotated and side bent left Sacrum right on right    Assessment and Plan:  Left shoulder tendinitis Left shoulder tendinitis and some AC inflammation.  Meloxicam  given for short-term.  Discussed icing regimen and home exercises, discussed which activities to do and which ones to avoid.  Increase activity slowly.  Follow-up with me again 6 to 12 weeks    Nonallopathic problems  Decision today to treat with OMT was based on Physical Exam  After verbal consent patient was treated with HVLA, ME, FPR techniques in cervical, rib, thoracic, lumbar, and sacral  areas  Patient tolerated the procedure well with improvement in symptoms  Patient given exercises, stretches and lifestyle modifications  See medications in patient instructions if given  Patient  will follow up in 4-8 weeks    The above documentation has been reviewed and is accurate and complete Arthea CHRISTELLA Sharps, DO          Note: This dictation was prepared with Dragon dictation along with smaller phrase technology. Any transcriptional errors  that result from this process are unintentional.            [1] No Known Allergies  "

## 2024-11-10 ENCOUNTER — Ambulatory Visit: Admitting: Family Medicine

## 2024-11-10 ENCOUNTER — Encounter: Payer: Self-pay | Admitting: Family Medicine

## 2024-11-10 ENCOUNTER — Other Ambulatory Visit: Payer: Self-pay

## 2024-11-10 VITALS — BP 108/72 | HR 86 | Ht 71.0 in | Wt 212.0 lb

## 2024-11-10 DIAGNOSIS — M9901 Segmental and somatic dysfunction of cervical region: Secondary | ICD-10-CM | POA: Diagnosis not present

## 2024-11-10 DIAGNOSIS — M7582 Other shoulder lesions, left shoulder: Secondary | ICD-10-CM | POA: Diagnosis not present

## 2024-11-10 DIAGNOSIS — M9908 Segmental and somatic dysfunction of rib cage: Secondary | ICD-10-CM

## 2024-11-10 DIAGNOSIS — M9904 Segmental and somatic dysfunction of sacral region: Secondary | ICD-10-CM

## 2024-11-10 DIAGNOSIS — M25512 Pain in left shoulder: Secondary | ICD-10-CM

## 2024-11-10 DIAGNOSIS — M9903 Segmental and somatic dysfunction of lumbar region: Secondary | ICD-10-CM | POA: Diagnosis not present

## 2024-11-10 DIAGNOSIS — M9902 Segmental and somatic dysfunction of thoracic region: Secondary | ICD-10-CM | POA: Diagnosis not present

## 2024-11-10 MED ORDER — MELOXICAM 15 MG PO TABS: 15.0000 mg | ORAL_TABLET | Freq: Every day | ORAL | 0 refills | Status: AC

## 2024-11-10 NOTE — Assessment & Plan Note (Addendum)
 Left shoulder tendinitis and some AC inflammation.  Meloxicam  given for short-term.  Discussed icing regimen and home exercises, discussed which activities to do and which ones to avoid.  Increase activity slowly.  Follow-up with me again 6 to 12 weeks meloxicam  prescribed

## 2024-11-10 NOTE — Patient Instructions (Signed)
 Meloxicam  15mg   Shoulder exercises Send me labs See me in 2 months

## 2024-11-19 ENCOUNTER — Telehealth (INDEPENDENT_AMBULATORY_CARE_PROVIDER_SITE_OTHER): Payer: Self-pay | Admitting: Pulmonary Disease

## 2024-11-19 DIAGNOSIS — R069 Unspecified abnormalities of breathing: Secondary | ICD-10-CM | POA: Diagnosis not present

## 2024-11-19 DIAGNOSIS — G4733 Obstructive sleep apnea (adult) (pediatric): Secondary | ICD-10-CM

## 2024-11-19 NOTE — Telephone Encounter (Signed)
 4% AHI was not significant 3% AHI was mild

## 2024-11-25 ENCOUNTER — Ambulatory Visit (HOSPITAL_BASED_OUTPATIENT_CLINIC_OR_DEPARTMENT_OTHER)

## 2024-11-25 ENCOUNTER — Encounter (HOSPITAL_BASED_OUTPATIENT_CLINIC_OR_DEPARTMENT_OTHER): Payer: Self-pay

## 2024-11-25 VITALS — BP 111/77 | HR 85 | Ht 71.0 in | Wt 215.0 lb

## 2024-11-25 DIAGNOSIS — G4733 Obstructive sleep apnea (adult) (pediatric): Secondary | ICD-10-CM | POA: Diagnosis not present

## 2024-11-25 NOTE — Progress Notes (Signed)
 "  @Patient  ID: Raymond Arlys Raddle., male    DOB: 1986-03-19, 39 y.o.   MRN: [id number]  Chief Complaint  Patient presents with   Follow-up    Sleep     Referring provider: Loretha [name], MD  HPI: Discussed the use of AI scribe software for clinical note transcription with the patient, who gave verbal consent to proceed.  History of Present Illness Raymond Branch is a 39 year old male who presents for follow up of his sleep study. He was referred by another doctor for evaluation of sleep apnea.  He underwent a sleep study which revealed mild sleep apnea, characterized by approximately 13.5 events per hour.  He has not previously used a CPAP machine or an oral appliance for treatment.  He has not had any recent follow-up for a deviated septum, which was addressed surgically three to four years ago. The surgery did not significantly change his symptoms.  Last OV 10/06/2024: Raymond Branch is a 39 year old male with PMH of Crohn's disease and Graves' disease who presents with c/o constant fatigue and snoring for a sleep consult.   He experiences constant fatigue despite sleeping eight to nine hours per night. He goes to bed between 10 and 11 PM and wakes up between 6:30 and 7 AM. He falls asleep quickly, within two minutes, and does not recall waking up during the night, except for occasional bathroom trips. No episodes of waking up short of breath or gasping for air. The fatigue has persisted over the years, initially attributed to Crohn's disease and Graves' disease, but has not improved despite treatment for these conditions.   His wife reports worsening snoring over the past six months, often having to wake him or ask him to change positions. Snoring is worse when he sleeps on his right side. He has not been told that he stops breathing during sleep.   He has a history of Crohn's disease and Graves' disease, both active in 2020, causing his weight to drop  to 175 pounds. His usual adult weight is between 195 and 200 pounds, but he is currently over 210 pounds. He underwent sinus surgery for a deviated septum, which was repaired in 2023 and without issues since.   No history of high blood pressure, heart attacks, strokes, heart rhythm issues, palpitations, or shortness of breath. He has never smoked and consumes alcohol a couple of times a week. He does not use drugs or marijuana. His father used a machine for sleep apnea.     TEST/EVENTS : Epworth 9  HST 10/30/2024:  AHI 3% mild OSA AHI 13.5/hr; AHI 4% 4.6/hr.  O2 sat nadir 88%.  Allergies[1]   There is no immunization history on file for this patient.  Past Medical History:  Diagnosis Date   Crohn's colitis (HCC)    Graves disease     Tobacco History: Tobacco Use History[2] Counseling given: Not Answered   Outpatient Medications Prior to Visit  Medication Sig Dispense Refill   Ascorbic Acid (VITAMIN C) 1000 MG tablet Take 1,000 mg by mouth daily.     clobetasol  cream (TEMOVATE ) 0.05 % Apply 1 Application topically 2 (two) times daily. 60 g 2   loratadine (CLARITIN) 10 MG tablet Take 10 mg by mouth daily.     meloxicam  (MOBIC ) 15 MG tablet Take 1 tablet (15 mg total) by mouth daily. 30 tablet 0   Multiple Vitamin (MULTIVITAMIN ADULT PO) Take by mouth.     No  facility-administered medications prior to visit.     Review of Systems: as per hpi  Constitutional:   No  weight loss, night sweats,  Fevers, chills, fatigue, or  lassitude.  HEENT:   No headaches,  Difficulty swallowing,  Tooth/dental problems, or  Sore throat,                No sneezing, itching, ear ache, nasal congestion, post nasal drip,   CV:  No chest pain,  Orthopnea, PND, swelling in lower extremities, anasarca, dizziness, palpitations, syncope.   GI  No heartburn, indigestion, abdominal pain, nausea, vomiting, diarrhea, change in bowel habits, loss of appetite, bloody stools.   Resp: No shortness of breath  with exertion or at rest.  No excess mucus, no productive cough,  No non-productive cough,  No coughing up of blood.  No change in color of mucus.  No wheezing.  No chest wall deformity  Skin: no rash or lesions.  GU: no dysuria, change in color of urine, no urgency or frequency.  No flank pain, no hematuria   MS:  No joint pain or swelling.  No decreased range of motion.  No back pain.    Physical Exam  BP 111/77   Pulse 85   Ht 5' 11 (1.803 m)   Wt 215 lb (97.5 kg)   SpO2 95%   BMI 29.99 kg/m   GEN: A/Ox3; pleasant , NAD, well nourished    HEENT:  [place]/AT,  EACs-clear, TMs-wnl, NOSE-clear, THROAT-clear, no lesions, no postnasal drip or exudate noted.   NECK:  Supple w/ fair ROM; no JVD; normal carotid impulses w/o bruits; no thyromegaly or nodules palpated; no lymphadenopathy.    RESP  Clear  P & A; w/o, wheezes/ rales/ or rhonchi. no accessory muscle use, no dullness to percussion  CARD:  RRR, no m/r/g, no peripheral edema, pulses intact, no cyanosis or clubbing.  GI:   Soft & nt; nml bowel sounds; no organomegaly or masses detected.   Musco: Warm bil, no deformities or joint swelling noted.   Neuro: alert, no focal deficits noted.    Skin: Warm, no lesions or rashes    Lab Results:  CBC    Component Value Date/Time   WBC 14.6 (H) 06/23/2022 1645   RBC 4.56 06/23/2022 1645   HGB 14.4 06/23/2022 1645   HCT 38.8 (L) 06/23/2022 1645   PLT 210 06/23/2022 1645   MCV 85.1 06/23/2022 1645   MCH 31.6 06/23/2022 1645   MCHC 37.1 (H) 06/23/2022 1645   RDW 12.5 06/23/2022 1645   LYMPHSABS 2.0 06/23/2022 1645   MONOABS 1.4 (H) 06/23/2022 1645   EOSABS 0.0 06/23/2022 1645   BASOSABS 0.0 06/23/2022 1645    BMET    Component Value Date/Time   NA 134 (L) 06/23/2022 1645   K 3.4 (L) 06/23/2022 1645   CL 102 06/23/2022 1645   CO2 23 06/23/2022 1645   GLUCOSE 143 (H) 06/23/2022 1645   BUN 11 06/23/2022 1645   CREATININE 0.69 06/23/2022 1645   CALCIUM 9.2  06/23/2022 1645   GFRNONAA >60 06/23/2022 1645   GFRAA >60 10/22/2019 2018    BNP No results found for: BNP  ProBNP No results found for: PROBNP  Imaging: US  LIMITED JOINT SPACE STRUCTURES UP LEFT(NO LINKED CHARGES) Result Date: 11/16/2024 Limited muscular skeletal ultrasound was performed and interpreted by CLAUDENE HUSSAR, M Limited ultrasound shows hypoechoic changes of the subscapularis and the supraspinatus noted..  Significant the left shoulder.  No true tear noted  with maybe some mild interstitial tearing noted.  Hypoechoic changes noted in the acromioclavicular joint as well. Impression: Tendinitis with effusion of the left acromioclavicular joint    Administration History     None           No data to display          No results found for: NITRICOXIDE   Assessment & Plan:   Assessment & Plan OSA (obstructive sleep apnea) Assessment and Plan Assessment & Plan Obstructive sleep apnea Mild obstructive sleep apnea with AHI of 13.5. Prefers less invasive treatments over CPAP. - Recommended sleep hygiene: consistent schedule, avoid caffeine/alcohol before bed, elevate head, sleep on side. - Referred to orthodontist for oral appliance evaluation. - Discussed potential Inspire device if condition worsens. - Encouraged healthy diet and exercise for weight management.   Return in about 4 months (around 03/25/2025).  Candis Dandy, PA-C 11/25/2024      [1] No Known Allergies [2]  Social History Tobacco Use  Smoking Status Never  Smokeless Tobacco Never   "

## 2024-11-25 NOTE — Patient Instructions (Signed)
 Referral placed to Orthodontics for oral appliance to treat sleep apnea.  Plan for follow up in 3-4 months.

## 2025-01-12 ENCOUNTER — Ambulatory Visit: Admitting: Family Medicine
